# Patient Record
Sex: Female | Born: 1960
Health system: Southern US, Community
[De-identification: ages and names within clinical notes are randomized; demographics above are authoritative.]

## PROBLEM LIST (undated history)

## (undated) DIAGNOSIS — G245 Blepharospasm: Secondary | ICD-10-CM

## (undated) DIAGNOSIS — D219 Benign neoplasm of connective and other soft tissue, unspecified: Secondary | ICD-10-CM

## (undated) DIAGNOSIS — D259 Leiomyoma of uterus, unspecified: Secondary | ICD-10-CM

## (undated) HISTORY — DX: Leiomyoma of uterus, unspecified: D25.9

## (undated) HISTORY — DX: Blepharospasm: G24.5

## (undated) HISTORY — PX: BILATERAL SALPINGECTOMY: SHX5743

## (undated) HISTORY — DX: Benign neoplasm of connective and other soft tissue, unspecified: D21.9

---

## 2012-07-23 ENCOUNTER — Emergency Department (HOSPITAL_COMMUNITY)
Admission: EM | Admit: 2012-07-23 | Discharge: 2012-07-24 | Disposition: A | Discharge status: Home / Self Care | Payer: Self-pay | Attending: Emergency Medicine | Admitting: Emergency Medicine

## 2012-07-23 ENCOUNTER — Emergency Department (HOSPITAL_COMMUNITY): Payer: Self-pay

## 2012-07-23 ENCOUNTER — Encounter (HOSPITAL_COMMUNITY): Payer: Self-pay | Admitting: *Deleted

## 2012-07-23 DIAGNOSIS — M7989 Other specified soft tissue disorders: Secondary | ICD-10-CM | POA: Insufficient documentation

## 2012-07-23 DIAGNOSIS — Z79899 Other long term (current) drug therapy: Secondary | ICD-10-CM | POA: Insufficient documentation

## 2012-07-23 DIAGNOSIS — R0602 Shortness of breath: Secondary | ICD-10-CM | POA: Insufficient documentation

## 2012-07-23 DIAGNOSIS — R42 Dizziness and giddiness: Secondary | ICD-10-CM | POA: Insufficient documentation

## 2012-07-23 LAB — CBC WITH DIFFERENTIAL/PLATELET
Basophils Absolute: 0.1 K/uL (ref 0.0–0.1)
Basophils Relative: 1 % (ref 0–1)
Eosinophils Absolute: 0.4 K/uL (ref 0.0–0.7)
Eosinophils Relative: 9 % — ABNORMAL HIGH (ref 0–5)
HCT: 39.7 % (ref 36.0–46.0)
Hemoglobin: 13.8 g/dL (ref 12.0–15.0)
Lymphocytes Relative: 53 % — ABNORMAL HIGH (ref 12–46)
Lymphs Abs: 2.4 10*3/uL (ref 0.7–4.0)
MCH: 31.8 pg (ref 26.0–34.0)
MCHC: 34.8 g/dL (ref 30.0–36.0)
MCV: 91.5 fL (ref 78.0–100.0)
Monocytes Absolute: 0.4 10*3/uL (ref 0.1–1.0)
Monocytes Relative: 9 % (ref 3–12)
Neutro Abs: 1.2 10*3/uL — ABNORMAL LOW (ref 1.7–7.7)
Neutrophils Relative %: 28 % — ABNORMAL LOW (ref 43–77)
Platelets: 196 K/uL (ref 150–400)
RBC: 4.34 MIL/uL (ref 3.87–5.11)
RDW: 12.6 % (ref 11.5–15.5)
WBC: 4.5 K/uL (ref 4.0–10.5)

## 2012-07-23 LAB — COMPREHENSIVE METABOLIC PANEL
Albumin: 3.8 g/dL (ref 3.5–5.2)
Alkaline Phosphatase: 56 U/L (ref 39–117)
BUN: 15 mg/dL (ref 6–23)
CO2: 25 mEq/L (ref 19–32)
Chloride: 101 mEq/L (ref 96–112)
GFR calc Af Amer: 63 mL/min — ABNORMAL LOW (ref >90)
GFR calc non Af Amer: 54 mL/min — ABNORMAL LOW (ref >90)
Glucose, Bld: 85 mg/dL (ref 70–99)
Potassium: 3.8 mEq/L (ref 3.5–5.1)
Total Bilirubin: 0.3 mg/dL (ref 0.3–1.2)

## 2012-07-23 LAB — COMPREHENSIVE METABOLIC PANEL WITH GFR
ALT: 14 U/L (ref 0–35)
AST: 25 U/L (ref 0–37)
Calcium: 9.4 mg/dL (ref 8.4–10.5)
Creatinine, Ser: 1.15 mg/dL — ABNORMAL HIGH (ref 0.50–1.10)
Sodium: 137 meq/L (ref 135–145)
Total Protein: 7.8 g/dL (ref 6.0–8.3)

## 2012-07-23 LAB — URINALYSIS, ROUTINE W REFLEX MICROSCOPIC
Bilirubin Urine: NEGATIVE
Glucose, UA: NEGATIVE mg/dL
Hgb urine dipstick: NEGATIVE
Ketones, ur: NEGATIVE mg/dL
Nitrite: NEGATIVE
Protein, ur: NEGATIVE mg/dL
Specific Gravity, Urine: 1.004 — ABNORMAL LOW (ref 1.005–1.030)
Urobilinogen, UA: 0.2 mg/dL (ref 0.0–1.0)
pH: 6.5 (ref 5.0–8.0)

## 2012-07-23 LAB — URINE MICROSCOPIC-ADD ON

## 2012-07-23 LAB — TROPONIN I: Troponin I: <0.3 ng/mL (ref <0.30)

## 2012-07-23 NOTE — ED Provider Notes (Signed)
Medical screening examination/treatment/procedure(s) were performed by non-physician practitioner and as supervising physician I was immediately available for consultation/collaboration.   Gavin Pound. Oletta Lamas, MD 07/23/12 [phone]

## 2012-07-23 NOTE — ED Notes (Signed)
The pt is c/o bi-lateral leg swelling  For several days with dizziness.  No pain in her legs but tingling and numbness

## 2012-07-23 NOTE — ED Notes (Signed)
No known  Injury no pain anywhere.  No previous medical history

## 2012-07-23 NOTE — ED Provider Notes (Signed)
History    CSN: [id number] Arrival date & time 07/23/12  1948  First MD Initiated Contact with Patient 07/23/12 2133     Chief Complaint  Patient presents with  . Leg Swelling   (Consider location/radiation/quality/duration/timing/severity/associated sxs/prior Treatment) HPI  52 year old female with no significant past medical history presents complaining of leg swelling.patient reports for the past 2 weeks she has been experiencing shortness of breath when she wakes up in the middle of the night. States it takes her a little bit to catch her breath. she usually sleeps with 2 pillows. Yesterday while walking she noticed that both of her leg is swollen. Swelling seems to improve when she rest. Today she noticed the swelling returns with sensation of tightness around both of her legs. She also endorsed feeling dizziness for the past several days symptoms usually lasting only for a few minutes and resolved on its own. She denies fever, chills, headache, chest pain, dyspnea on exertion, abdominal pain, nausea, vomiting, diarrhea, or rash. She does not have any prior history of CHF. Has not changed her diet. Has no recent sickness. She has no chronic medical problems. Patient is a nonsmoker. No specific treatment tried. Patient denies prior history of PE or DVT. She denies recent surgery, taking exogenous hormone, recent long travel, or prolonged bed rest.  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    History reviewed. No pertinent past medical history. History reviewed. No pertinent past surgical history. No family history on file. History  Substance Use Topics  . Smoking status: Never Smoker   . Smokeless tobacco: Not on file  . Alcohol Use: No   OB History   Grav Para Term Preterm Abortions TAB SAB Ect Mult Living                 Review of Systems  All other systems reviewed and are negative.    Allergies  Review of  patient's allergies indicates no known allergies.  Home Medications   Current Outpatient Rx  Name  Route  Sig  Dispense  Refill  . fish oil-omega-3 fatty acids 1000 MG capsule   Oral   Take 1 g by mouth daily.         Marland Kitchen GARLIC PO   Oral   Take 2 tablets by mouth daily.          BP 123/72  Pulse 74  Temp(Src) 97.9 F (36.6 C)  Resp 20  SpO2 98% Physical Exam  Nursing note and vitals reviewed. Constitutional: She is oriented to person, place, and time. She appears well-developed and well-nourished. No distress.  Awake, alert, nontoxic appearance  HENT:  Head: Atraumatic.  Eyes: Conjunctivae are normal. Right eye exhibits no discharge. Left eye exhibits no discharge.  Neck: Neck supple.  No JVD.  Cardiovascular: Normal rate, regular rhythm and intact distal pulses.   Pulmonary/Chest: Effort normal. No respiratory distress. She exhibits no tenderness.  Abdominal: Soft. There is no tenderness. There is no rebound.  Musculoskeletal: She exhibits edema (trace pretibial edema noted to both legs. Normal dorsalis pedis pulse. Bilateral lower string cheese without palpable cords, erythema, negative Homans sign.). She exhibits no tenderness.  ROM appears intact, no obvious focal weakness  Neurological: She is alert and oriented to person, place, and time.  Mental status and motor strength appears intact  Skin: No rash noted.  Psychiatric: She has a normal mood and affect.  ED Course  Procedures (including critical care time)   Date: 07/23/2012  Rate: 68  Rhythm: normal sinus rhythm  QRS Axis: normal  Intervals: normal  ST/T Wave abnormalities: normal  Conduction Disutrbances: none  Narrative Interpretation:   Old EKG Reviewed: no prior for comparison    9:46 PM Patient here complaining of intermittent bouts of shortness of breath and also having edema of lower legs. She is in no acute respiratory distress. Her lungs are clear on auscultation. She has very mild  pretibial edema to both legs. She has no significant risk factor concerning for PE or DVT. She has no other chronic medical problems. Workup initiated.  11:23 PM Patient's labs are reassuring. Chest x-ray shows shows a density adjacent to the cardiac apex that is likely represents a pericardial fat pad, pericardial cyst, or a focus of pneumonia, however pulmonary parenchymal mass lesion is difficult to exclude.I discussed the finding with patient and recommend followup chest CT in the next 6 months for further evaluation. Patient denies cough or having fever concerning for pneumonia.  Her BNP is unremarkable. Do not suspect CHF as a cause. Care discussed with attending. Will give patient's resources for outpatient followup. Return precautions discussed.  Labs Reviewed  CBC WITH DIFFERENTIAL - Abnormal; Notable for the following:    Neutrophils Relative % 28 (*)    Neutro Abs 1.2 (*)    Lymphocytes Relative 53 (*)    Eosinophils Relative 9 (*)    All other components within normal limits  URINALYSIS, ROUTINE W REFLEX MICROSCOPIC - Abnormal; Notable for the following:    Specific Gravity, Urine 1.004 (*)    Leukocytes, UA TRACE (*)    All other components within normal limits  URINE MICROSCOPIC-ADD ON  COMPREHENSIVE METABOLIC PANEL  TROPONIN I   Dg Chest 2 View  07/23/2012   *RADIOLOGY REPORT*  Clinical Data: Short of breath.  CHEST - 2 VIEW  Comparison: None.  Findings: Opacity is present along the left heart border extending to the costophrenic angle.  This is not well seen on the lateral view.  Statistically this may represent pericardial fat pad, pericardial cyst, or a focus of pneumonia, however a pulmonary parenchymal mass lesion is difficult to exclude.  Consider follow- up chest CT with infusion for further evaluation. The cardiopericardial silhouette appears within normal limits.  Right lung clear.  IMPRESSION: Density adjacent to the cardiac apex as detailed above.  Consider follow-up  chest CT for further evaluation.   Original Report Authenticated By: Andreas Newport, M.D.   1. Leg swelling     MDM  BP 123/72  Pulse 74  Temp(Src) 97.9 F (36.6 C)  Resp 20  SpO2 98%  I have reviewed nursing notes and vital signs. I personally reviewed the imaging tests through PACS system  I reviewed available ER/hospitalization records thought the EMR   Fayrene Helper, [place] 07/23/12 2336

## 2013-03-08 ENCOUNTER — Other Ambulatory Visit: Payer: Self-pay | Admitting: Emergency Medicine

## 2013-03-08 DIAGNOSIS — J984 Other disorders of lung: Secondary | ICD-10-CM

## 2013-03-08 DIAGNOSIS — R9389 Abnormal findings on diagnostic imaging of other specified body structures: Secondary | ICD-10-CM

## 2013-03-20 ENCOUNTER — Other Ambulatory Visit: Payer: Self-pay

## 2013-03-26 ENCOUNTER — Other Ambulatory Visit: Payer: Self-pay | Admitting: Obstetrics & Gynecology

## 2013-03-26 DIAGNOSIS — R229 Localized swelling, mass and lump, unspecified: Secondary | ICD-10-CM

## 2013-03-26 DIAGNOSIS — IMO0002 Reserved for concepts with insufficient information to code with codable children: Secondary | ICD-10-CM

## 2013-03-26 DIAGNOSIS — D259 Leiomyoma of uterus, unspecified: Secondary | ICD-10-CM

## 2013-03-27 ENCOUNTER — Other Ambulatory Visit: Payer: Self-pay | Admitting: Obstetrics & Gynecology

## 2013-03-27 ENCOUNTER — Other Ambulatory Visit (HOSPITAL_COMMUNITY): Payer: Self-pay | Admitting: Obstetrics & Gynecology

## 2013-03-27 ENCOUNTER — Ambulatory Visit (HOSPITAL_COMMUNITY)
Admission: RE | Admit: 2013-03-27 | Discharge: 2013-03-27 | Disposition: A | Discharge status: Home / Self Care | Payer: BC Managed Care – PPO | Source: Ambulatory Visit | Attending: Obstetrics & Gynecology | Admitting: Obstetrics & Gynecology

## 2013-03-27 ENCOUNTER — Ambulatory Visit
Admission: RE | Admit: 2013-03-27 | Discharge: 2013-03-27 | Disposition: A | Discharge status: Home / Self Care | Payer: BC Managed Care – PPO | Source: Ambulatory Visit | Attending: Obstetrics & Gynecology | Admitting: Obstetrics & Gynecology

## 2013-03-27 DIAGNOSIS — R609 Edema, unspecified: Secondary | ICD-10-CM

## 2013-03-27 DIAGNOSIS — Z09 Encounter for follow-up examination after completed treatment for conditions other than malignant neoplasm: Secondary | ICD-10-CM

## 2013-03-27 DIAGNOSIS — M7989 Other specified soft tissue disorders: Secondary | ICD-10-CM

## 2013-03-27 NOTE — Progress Notes (Signed)
VASCULAR LAB PRELIMINARY  PRELIMINARY  PRELIMINARY  PRELIMINARY  Bilateral lower extremity venous duplex completed.    Preliminary report:  Bilateral:  No evidence of DVT, superficial thrombosis, or Baker's Cyst.   Stacey Weiss, RVS 03/27/2013, 5:04 PM

## 2013-04-02 ENCOUNTER — Other Ambulatory Visit: Payer: BC Managed Care – PPO

## 2013-04-02 ENCOUNTER — Other Ambulatory Visit: Payer: Self-pay

## 2013-04-03 ENCOUNTER — Other Ambulatory Visit: Payer: Self-pay | Admitting: Obstetrics & Gynecology

## 2013-04-03 ENCOUNTER — Ambulatory Visit
Admission: RE | Admit: 2013-04-03 | Discharge: 2013-04-03 | Disposition: A | Discharge status: Home / Self Care | Payer: BC Managed Care – PPO | Source: Ambulatory Visit | Attending: Obstetrics & Gynecology | Admitting: Obstetrics & Gynecology

## 2013-04-03 DIAGNOSIS — D259 Leiomyoma of uterus, unspecified: Secondary | ICD-10-CM

## 2013-04-03 DIAGNOSIS — R229 Localized swelling, mass and lump, unspecified: Secondary | ICD-10-CM

## 2013-04-03 DIAGNOSIS — IMO0002 Reserved for concepts with insufficient information to code with codable children: Secondary | ICD-10-CM

## 2013-04-03 MED ORDER — IOHEXOL 300 MG/ML  SOLN
100.0000 mL | Freq: Once | INTRAMUSCULAR | Status: AC | PRN
  Administered 2013-04-03: 100 mL via INTRAVENOUS

## 2013-04-05 ENCOUNTER — Encounter (HOSPITAL_COMMUNITY): Payer: Self-pay | Admitting: Pharmacist

## 2013-04-06 ENCOUNTER — Ambulatory Visit (INDEPENDENT_AMBULATORY_CARE_PROVIDER_SITE_OTHER): Payer: BC Managed Care – PPO | Admitting: Internal Medicine

## 2013-04-06 ENCOUNTER — Encounter: Payer: Self-pay | Admitting: Internal Medicine

## 2013-04-06 VITALS — BP 116/68 | HR 63 | Ht 59.0 in | Wt 144.0 lb

## 2013-04-06 DIAGNOSIS — Q248 Other specified congenital malformations of heart: Secondary | ICD-10-CM

## 2013-04-06 NOTE — Progress Notes (Signed)
   Subjective:    Patient ID: Stacey Weiss, female    DOB: 06-26-1960, 53 y.o.   MRN: [id number]  HPI  50 yobf from [place] never smoker evaluated by [facility] Cardiologist 2009 with pericardial cyst who did not recommend any intervention referred 04/06/2013 by Dr Dellis Filbert for preop pulmonary eval for hysterectomy with abn cxr/ ct.  04/06/2013 1st [facility] office visit/ Stacey Weiss    Chief Complaint  Patient presents with  . Advice Only    Referred for sx clearance..pt had a nodule on ct chest.  pt denies any breathing complaints at this time.   Not limited by breathing from desired activities   No obvious other patterns in day to day or daytime variabilty or assoc chronic cough or cp or chest tightness, subjective wheeze overt sinus or hb symptoms. No unusual exp hx or h/o childhood pna/ asthma or knowledge of premature birth.  Sleeping ok without nocturnal  or early am exacerbation  of respiratory  c/o's or need for noct saba. Also denies any obvious fluctuation of symptoms with weather or environmental changes or other aggravating or alleviating factors except as outlined above   Current Medications, Allergies, Complete Past Medical History, Past Surgical History, Family History, and Social History were reviewed in [employer] record.            Review of Systems  Constitutional: Negative for fever and unexpected weight change.  HENT: Negative for congestion, dental problem, ear pain, nosebleeds, postnasal drip, rhinorrhea, sinus pressure, sneezing, sore throat and trouble swallowing.   Eyes: Negative for redness and itching.  Respiratory: Negative for cough, chest tightness, shortness of breath and wheezing.   Cardiovascular: Negative for palpitations and leg swelling.  Gastrointestinal: Negative for nausea and vomiting.  Genitourinary: Negative for dysuria.  Musculoskeletal: Negative for joint swelling.  Skin: Negative for rash.  Neurological: Negative for  headaches.  Hematological: Does not bruise/bleed easily.  Psychiatric/Behavioral: Negative for dysphoric mood. The patient is not nervous/anxious.        Objective:   Physical Exam  Wt Readings from Last 3 Encounters:  04/06/13 144 lb (65.318 kg)     HEENT: nl dentition, turbinates, and orophanx. Nl external ear canals without cough reflex   NECK :  without JVD/Nodes/TM/ nl carotid upstrokes bilaterally   LUNGS: no acc muscle use, clear to A and P bilaterally without cough on insp or exp maneuvers   CV:  RRR  no s3 or murmur or increase in P2, no edema   ABD:  Massive uterine enlargement occupies at least half of abdominal cavity but good diaphragm excursion supine   MS:  warm without deformities, calf tenderness, cyanosis or clubbing  SKIN: warm and dry without lesions    NEURO:  alert, approp, no deficits     CT chest 04/03/13 The structure at the left cardiophrenic angle demonstrates an  appearance typical for a benign appearing pericardial cyst.      Assessment & Plan:

## 2013-04-06 NOTE — Patient Instructions (Signed)
You have a cyst in your chest that does not require surgery and is not a problem that would complicate your uterine surgery  Pulmonary follow up is as needed

## 2013-04-07 DIAGNOSIS — Q248 Other specified congenital malformations of heart: Secondary | ICD-10-CM | POA: Insufficient documentation

## 2013-04-07 NOTE — Assessment & Plan Note (Signed)
Discussed in detail with pt and radiology/ Dr Marilu Favre - this was eval by cards > 5 y prior to OV  And decision then as now is that in all likelihood this is a completely benign congenital pericardial cyst with no potential for malignancy nor "mass effect" on the thoracic cavity so rec  1) no further f/u 2) no contraindication to planned hysterectomy 3) pulmonary f/u prn

## 2013-04-09 NOTE — Patient Instructions (Addendum)
   Your procedure is scheduled on:  Tuesday, Mat 24  Enter through the [employer] of [facility] at:  Purdy up the phone at the desk and dial [phone] and inform us of your arrival.  Please call this number if you have any problems the morning of surgery: [phone]  Remember: Do not eat food after midnight: Monday Do not drink clear liquids after: 9 AM Tuesday, day of surgery Take these medicines the morning of surgery with a SIP OF WATER:  None  Do not wear jewelry, make-up, or FINGER nail polish No metal in your hair or on your body. Do not wear lotions, powders, perfumes.  You may wear deodorant.  Do not bring valuables to the hospital. Contacts, dentures or bridgework may not be worn into surgery.  Leave suitcase in the car. After Surgery it may be brought to your room. For patients being admitted to the hospital, checkout time is 11:00am the day of discharge.  Home with husband Akpan

## 2013-04-10 ENCOUNTER — Encounter (HOSPITAL_COMMUNITY): Payer: Self-pay

## 2013-04-10 ENCOUNTER — Encounter (HOSPITAL_COMMUNITY)
Admission: RE | Admit: 2013-04-10 | Discharge: 2013-04-10 | Disposition: A | Discharge status: Home / Self Care | Payer: BC Managed Care – PPO | Source: Ambulatory Visit | Attending: Obstetrics & Gynecology | Admitting: Obstetrics & Gynecology

## 2013-04-10 DIAGNOSIS — Z01812 Encounter for preprocedural laboratory examination: Secondary | ICD-10-CM | POA: Insufficient documentation

## 2013-04-10 LAB — CBC
HEMATOCRIT: 41.7 % (ref 36.0–46.0)
Hemoglobin: 14.4 g/dL (ref 12.0–15.0)
MCH: 32.4 pg (ref 26.0–34.0)
MCHC: 34.5 g/dL (ref 30.0–36.0)
MCV: 93.7 fL (ref 78.0–100.0)
PLATELETS: 180 10*3/uL (ref 150–400)
RBC: 4.45 MIL/uL (ref 3.87–5.11)
RDW: 12.3 % (ref 11.5–15.5)
WBC: 4.1 10*3/uL (ref 4.0–10.5)

## 2013-04-17 ENCOUNTER — Inpatient Hospital Stay (HOSPITAL_COMMUNITY)
Admission: RE | Admit: 2013-04-17 | Discharge: 2013-04-19 | DRG: 743 | Disposition: A | Discharge status: Home / Self Care | Payer: BC Managed Care – PPO | Source: Ambulatory Visit | Attending: Obstetrics & Gynecology | Admitting: Obstetrics & Gynecology

## 2013-04-17 ENCOUNTER — Encounter (HOSPITAL_COMMUNITY): Payer: BC Managed Care – PPO | Admitting: Anesthesiology

## 2013-04-17 ENCOUNTER — Inpatient Hospital Stay (HOSPITAL_COMMUNITY): Payer: BC Managed Care – PPO | Admitting: Anesthesiology

## 2013-04-17 ENCOUNTER — Encounter (HOSPITAL_COMMUNITY)
Admission: RE | Disposition: A | Discharge status: Home / Self Care | Payer: Self-pay | Source: Ambulatory Visit | Attending: Obstetrics & Gynecology

## 2013-04-17 ENCOUNTER — Encounter (HOSPITAL_COMMUNITY): Payer: Self-pay | Admitting: Anesthesiology

## 2013-04-17 DIAGNOSIS — R609 Edema, unspecified: Secondary | ICD-10-CM | POA: Diagnosis present

## 2013-04-17 DIAGNOSIS — D251 Intramural leiomyoma of uterus: Secondary | ICD-10-CM | POA: Diagnosis present

## 2013-04-17 DIAGNOSIS — Z9889 Other specified postprocedural states: Secondary | ICD-10-CM

## 2013-04-17 DIAGNOSIS — D252 Subserosal leiomyoma of uterus: Secondary | ICD-10-CM | POA: Diagnosis present

## 2013-04-17 DIAGNOSIS — N949 Unspecified condition associated with female genital organs and menstrual cycle: Secondary | ICD-10-CM | POA: Diagnosis present

## 2013-04-17 DIAGNOSIS — D250 Submucous leiomyoma of uterus: Principal | ICD-10-CM | POA: Diagnosis present

## 2013-04-17 HISTORY — PX: ABDOMINAL HYSTERECTOMY: SHX81

## 2013-04-17 LAB — TYPE AND SCREEN
ABO/RH(D): O POS
ANTIBODY SCREEN: NEGATIVE

## 2013-04-17 LAB — PREGNANCY, URINE: Preg Test, Ur: NEGATIVE

## 2013-04-17 LAB — ABO/RH: ABO/RH(D): O POS

## 2013-04-17 SURGERY — HYSTERECTOMY, ABDOMINAL
Anesthesia: General

## 2013-04-17 MED ORDER — HEPARIN SODIUM (PORCINE) 5000 UNIT/ML IJ SOLN
INTRAMUSCULAR | Status: DC | PRN
  Administered 2013-04-17: 5000 [IU] via SUBCUTANEOUS

## 2013-04-17 MED ORDER — OXYCODONE-ACETAMINOPHEN 5-325 MG PO TABS: 1.0000 | ORAL_TABLET | ORAL | Status: DC | PRN

## 2013-04-17 MED ORDER — CEFAZOLIN SODIUM-DEXTROSE 2-3 GM-% IV SOLR
2.0000 g | INTRAVENOUS | Status: AC
  Administered 2013-04-17: 2 g via INTRAVENOUS

## 2013-04-17 MED ORDER — FLUMAZENIL 0.5 MG/5ML IV SOLN
INTRAVENOUS | Status: DC | PRN
  Administered 2013-04-17: 0.2 mg via INTRAVENOUS

## 2013-04-17 MED ORDER — LIDOCAINE HCL (CARDIAC) 20 MG/ML IV SOLN
INTRAVENOUS | Status: DC | PRN
  Administered 2013-04-17: 70 mg via INTRAVENOUS
  Administered 2013-04-17: 30 mg via INTRAVENOUS

## 2013-04-17 MED ORDER — EPHEDRINE SULFATE 50 MG/ML IJ SOLN
INTRAMUSCULAR | Status: DC | PRN
  Administered 2013-04-17: 15 mg via INTRAVENOUS

## 2013-04-17 MED ORDER — MEPERIDINE HCL 25 MG/ML IJ SOLN
6.2500 mg | INTRAMUSCULAR | Status: DC | PRN
  Administered 2013-04-17: 6.25 mg via INTRAVENOUS

## 2013-04-17 MED ORDER — LACTATED RINGERS IV SOLN
INTRAVENOUS | Status: DC
  Administered 2013-04-17 (×3): via INTRAVENOUS

## 2013-04-17 MED ORDER — MEPERIDINE HCL 25 MG/ML IJ SOLN
INTRAMUSCULAR | Status: AC
  Filled 2013-04-17: qty 1

## 2013-04-17 MED ORDER — KETOROLAC TROMETHAMINE 30 MG/ML IJ SOLN
INTRAMUSCULAR | Status: DC | PRN
  Administered 2013-04-17: 30 mg via INTRAVENOUS

## 2013-04-17 MED ORDER — BUPIVACAINE HCL (PF) 0.25 % IJ SOLN
INTRAMUSCULAR | Status: DC | PRN
  Administered 2013-04-17: 10 mL

## 2013-04-17 MED ORDER — HEPARIN SODIUM (PORCINE) 5000 UNIT/ML IJ SOLN
INTRAMUSCULAR | Status: AC
  Filled 2013-04-17: qty 1

## 2013-04-17 MED ORDER — ONDANSETRON HCL 4 MG/2ML IJ SOLN: 4.0000 mg | Freq: Four times a day (QID) | INTRAMUSCULAR | Status: DC | PRN

## 2013-04-17 MED ORDER — MIDAZOLAM HCL 2 MG/2ML IJ SOLN
INTRAMUSCULAR | Status: AC
  Filled 2013-04-17: qty 2

## 2013-04-17 MED ORDER — PROPOFOL 10 MG/ML IV BOLUS
INTRAVENOUS | Status: DC | PRN
  Administered 2013-04-17: 150 mg via INTRAVENOUS
  Administered 2013-04-17: 20 mg via INTRAVENOUS

## 2013-04-17 MED ORDER — HYDROMORPHONE HCL PF 1 MG/ML IJ SOLN
INTRAMUSCULAR | Status: AC
  Filled 2013-04-17: qty 1

## 2013-04-17 MED ORDER — IBUPROFEN 600 MG PO TABS
600.0000 mg | ORAL_TABLET | Freq: Four times a day (QID) | ORAL | Status: DC | PRN
  Administered 2013-04-18 (×2): 600 mg via ORAL
  Filled 2013-04-17 (×2): qty 1

## 2013-04-17 MED ORDER — CEFAZOLIN SODIUM-DEXTROSE 2-3 GM-% IV SOLR
INTRAVENOUS | Status: AC
  Filled 2013-04-17: qty 50

## 2013-04-17 MED ORDER — KETOROLAC TROMETHAMINE 30 MG/ML IJ SOLN: 15.0000 mg | Freq: Once | INTRAMUSCULAR | Status: DC | PRN

## 2013-04-17 MED ORDER — LACTATED RINGERS IV SOLN
INTRAVENOUS | Status: DC
  Administered 2013-04-17 – 2013-04-18 (×2): via INTRAVENOUS

## 2013-04-17 MED ORDER — HYDROMORPHONE HCL PF 1 MG/ML IJ SOLN
0.2500 mg | INTRAMUSCULAR | Status: DC | PRN
  Administered 2013-04-17: 0.25 mg via INTRAVENOUS

## 2013-04-17 MED ORDER — ONDANSETRON HCL 4 MG/2ML IJ SOLN
INTRAMUSCULAR | Status: DC | PRN
  Administered 2013-04-17: 4 mg via INTRAVENOUS

## 2013-04-17 MED ORDER — ROCURONIUM BROMIDE 100 MG/10ML IV SOLN
INTRAVENOUS | Status: DC | PRN
  Administered 2013-04-17: 10 mg via INTRAVENOUS
  Administered 2013-04-17: 40 mg via INTRAVENOUS

## 2013-04-17 MED ORDER — ONDANSETRON HCL 4 MG/2ML IJ SOLN
INTRAMUSCULAR | Status: AC
  Filled 2013-04-17: qty 2

## 2013-04-17 MED ORDER — MIDAZOLAM HCL 2 MG/2ML IJ SOLN
INTRAMUSCULAR | Status: DC | PRN
  Administered 2013-04-17 (×2): 1 mg via INTRAVENOUS

## 2013-04-17 MED ORDER — PROMETHAZINE HCL 25 MG/ML IJ SOLN: 6.2500 mg | INTRAMUSCULAR | Status: DC | PRN

## 2013-04-17 MED ORDER — GLYCOPYRROLATE 0.2 MG/ML IJ SOLN
INTRAMUSCULAR | Status: DC | PRN
  Administered 2013-04-17: 0.6 mg via INTRAVENOUS

## 2013-04-17 MED ORDER — DEXAMETHASONE SODIUM PHOSPHATE 10 MG/ML IJ SOLN
INTRAMUSCULAR | Status: AC
  Filled 2013-04-17: qty 1

## 2013-04-17 MED ORDER — HYDROMORPHONE HCL PF 1 MG/ML IJ SOLN: 1.0000 mg | INTRAMUSCULAR | Status: DC | PRN

## 2013-04-17 MED ORDER — BUPIVACAINE HCL (PF) 0.25 % IJ SOLN
INTRAMUSCULAR | Status: AC
  Filled 2013-04-17: qty 30

## 2013-04-17 MED ORDER — FLUMAZENIL 0.5 MG/5ML IV SOLN
INTRAVENOUS | Status: AC
  Filled 2013-04-17: qty 5

## 2013-04-17 MED ORDER — FENTANYL CITRATE 0.05 MG/ML IJ SOLN
INTRAMUSCULAR | Status: DC | PRN
  Administered 2013-04-17 (×2): 50 ug via INTRAVENOUS
  Administered 2013-04-17: 100 ug via INTRAVENOUS
  Administered 2013-04-17 (×3): 50 ug via INTRAVENOUS

## 2013-04-17 MED ORDER — DEXAMETHASONE SODIUM PHOSPHATE 10 MG/ML IJ SOLN
INTRAMUSCULAR | Status: DC | PRN
  Administered 2013-04-17: 10 mg via INTRAVENOUS

## 2013-04-17 MED ORDER — ROCURONIUM BROMIDE 100 MG/10ML IV SOLN
INTRAVENOUS | Status: AC
  Filled 2013-04-17: qty 1

## 2013-04-17 MED ORDER — FENTANYL CITRATE 0.05 MG/ML IJ SOLN
INTRAMUSCULAR | Status: AC
  Filled 2013-04-17: qty 5

## 2013-04-17 MED ORDER — ONDANSETRON HCL 4 MG PO TABS: 4.0000 mg | ORAL_TABLET | Freq: Four times a day (QID) | ORAL | Status: DC | PRN

## 2013-04-17 MED ORDER — NEOSTIGMINE METHYLSULFATE 1 MG/ML IJ SOLN
INTRAMUSCULAR | Status: DC | PRN
  Administered 2013-04-17: 3 mg via INTRAVENOUS

## 2013-04-17 MED ORDER — GLYCOPYRROLATE 0.2 MG/ML IJ SOLN
INTRAMUSCULAR | Status: AC
  Filled 2013-04-17: qty 3

## 2013-04-17 MED ORDER — PROPOFOL 10 MG/ML IV EMUL
INTRAVENOUS | Status: AC
  Filled 2013-04-17: qty 20

## 2013-04-17 MED ORDER — EPHEDRINE 5 MG/ML INJ
INTRAVENOUS | Status: AC
  Filled 2013-04-17: qty 10

## 2013-04-17 MED ORDER — FENTANYL CITRATE 0.05 MG/ML IJ SOLN
INTRAMUSCULAR | Status: AC
  Filled 2013-04-17: qty 2

## 2013-04-17 SURGICAL SUPPLY — 49 items
BLADE SURG 10 STRL SS (BLADE) ×3 IMPLANT
CANISTER SUCT 3000ML (MISCELLANEOUS) ×3 IMPLANT
CHLORAPREP W/TINT 26ML (MISCELLANEOUS) ×3 IMPLANT
CLOTH BEACON ORANGE TIMEOUT ST (SAFETY) ×3 IMPLANT
DECANTER SPIKE VIAL GLASS SM (MISCELLANEOUS) IMPLANT
DERMABOND ADHESIVE PROPEN (GAUZE/BANDAGES/DRESSINGS) ×4
DERMABOND ADVANCED .7 DNX6 (GAUZE/BANDAGES/DRESSINGS) ×2 IMPLANT
DRAPE WARM FLUID 44X44 (DRAPE) IMPLANT
DRSG OPSITE POSTOP 4X10 (GAUZE/BANDAGES/DRESSINGS) ×3 IMPLANT
GAUZE SPONGE 4X4 16PLY XRAY LF (GAUZE/BANDAGES/DRESSINGS) ×3 IMPLANT
GLOVE BIO SURGEON STRL SZ 6.5 (GLOVE) ×2 IMPLANT
GLOVE BIO SURGEONS STRL SZ 6.5 (GLOVE) ×1
GLOVE BIOGEL PI IND STRL 7.0 (GLOVE) ×1 IMPLANT
GLOVE BIOGEL PI INDICATOR 7.0 (GLOVE) ×2
GOWN STRL REUS W/TWL LRG LVL3 (GOWN DISPOSABLE) ×9 IMPLANT
NEEDLE HYPO 25X1 1.5 SAFETY (NEEDLE) IMPLANT
PACK ABDOMINAL GYN (CUSTOM PROCEDURE TRAY) ×3 IMPLANT
PAD ABD 7.5X8 STRL (GAUZE/BANDAGES/DRESSINGS) ×3 IMPLANT
PAD OB MATERNITY 4.3X12.25 (PERSONAL CARE ITEMS) ×3 IMPLANT
PROTECTOR NERVE ULNAR (MISCELLANEOUS) ×3 IMPLANT
RETRACTOR WND ALEXIS 25 LRG (MISCELLANEOUS) ×1 IMPLANT
RTRCTR WOUND ALEXIS 25CM LRG (MISCELLANEOUS) ×3
SPONGE LAP 18X18 X RAY DECT (DISPOSABLE) ×6 IMPLANT
STAPLER VISISTAT 35W (STAPLE) ×3 IMPLANT
SUT MNCRL AB 3-0 PS2 27 (SUTURE) ×6 IMPLANT
SUT PDS AB 0 CTX 60 (SUTURE) ×3 IMPLANT
SUT PLAIN 2 0 (SUTURE) ×2
SUT PLAIN 3 0 CT 1 27 (SUTURE) IMPLANT
SUT PLAIN ABS 2-0 CT1 27XMFL (SUTURE) ×1 IMPLANT
SUT PROLENE 3 0 SH DA (SUTURE) IMPLANT
SUT VIC AB 0 CT1 18XCR BRD8 (SUTURE) ×3 IMPLANT
SUT VIC AB 0 CT1 27 (SUTURE) ×8
SUT VIC AB 0 CT1 27XBRD ANBCTR (SUTURE) ×4 IMPLANT
SUT VIC AB 0 CT1 36 (SUTURE) ×6 IMPLANT
SUT VIC AB 0 CT1 8-18 (SUTURE) ×6
SUT VIC AB 2-0 CT1 27 (SUTURE) ×2
SUT VIC AB 2-0 CT1 TAPERPNT 27 (SUTURE) ×1 IMPLANT
SUT VIC AB 2-0 SH 27 (SUTURE)
SUT VIC AB 2-0 SH 27XBRD (SUTURE) IMPLANT
SUT VIC AB 3-0 SH 27 (SUTURE)
SUT VIC AB 3-0 SH 27X BRD (SUTURE) IMPLANT
SUT VICRYL 0 TIES 12 18 (SUTURE) ×3 IMPLANT
SUT VICRYL 1 TIES 12X18 (SUTURE) IMPLANT
SUT VICRYL 3 0 BR 18  UND (SUTURE)
SUT VICRYL 3 0 BR 18 UND (SUTURE) IMPLANT
SYR CONTROL 10ML LL (SYRINGE) IMPLANT
TOWEL OR 17X24 6PK STRL BLUE (TOWEL DISPOSABLE) ×6 IMPLANT
TRAY FOLEY CATH 14FR (SET/KITS/TRAYS/PACK) ×3 IMPLANT
WATER STERILE IRR 1000ML POUR (IV SOLUTION) ×6 IMPLANT

## 2013-04-17 NOTE — Transfer of Care (Signed)
Immediate Anesthesia Transfer of Care Note  Patient: Stacey Weiss  Procedure(s) Performed: Procedure(s): HYSTERECTOMY ABDOMINAL BILATERAL SALPINGECTOMY Through Vertical Incision (N/A)  Patient Location: PACU  Anesthesia Type:General  Level of Consciousness: awake, sedated and patient cooperative  Airway & Oxygen Therapy: Patient Spontanous Breathing and Patient connected to face mask oxygen  Post-op Assessment: Report given to PACU RN and Post -op Vital signs reviewed and stable  Post vital signs: Reviewed and stable  Complications: No apparent anesthesia complications

## 2013-04-17 NOTE — Anesthesia Postprocedure Evaluation (Signed)
  Anesthesia Post-op Note  Patient: Stacey Weiss  Procedure(s) Performed: Procedure(s): HYSTERECTOMY ABDOMINAL BILATERAL SALPINGECTOMY Through Vertical Incision (N/A)  Patient Location: PACU  Anesthesia Type:General  Level of Consciousness: awake, alert  and oriented  Airway and Oxygen Therapy: Patient Spontanous Breathing  Post-op Pain: mild  Post-op Assessment: Post-op Vital signs reviewed, Patient's Cardiovascular Status Stable, Respiratory Function Stable, Patent Airway, No signs of Nausea or vomiting and Pain level controlled  Post-op Vital Signs: Reviewed and stable  Complications: No apparent anesthesia complications

## 2013-04-17 NOTE — Op Note (Signed)
04/17/2013  1:59 PM  PATIENT:  Stacey Weiss  53 y.o. female  PRE-OPERATIVE DIAGNOSIS:  Large Uterine Myomas, Pelvic Pain, lower limb oedema  POST-OPERATIVE DIAGNOSIS:  Large Uterine Myomas, Pelvic Pain, lower limb oedema  PROCEDURE:  TOTAL ABDOMINAL HYSTERECTOMY, BILATERAL SALPINGECTOMIES, LEFT OOPHORECTOMY  THROUGH A MIDLINE VERTICAL INCISION  SURGEON:  Surgeon(s): Princess Bruins, MD Elveria Royals, MD  ASSISTANTS: Elveria Royals MD   ANESTHESIA:   general  PROCEDURE:  With endotracheal intubation the patient is in the caput dorsal position. She is prepped with Betadine on the abdomen, suprapubic area, vulva and vagina.  A Foley is inserted in the bladder. She is draped as usual.  We infiltrate the subcutaneous tissue with Marcaine one quarter plain 10 cc at the site of the median vertical incision.  We make a median vertical incision with the scalpel.  We opened the adipose tissue with the electrocautery.  The aponeurosis was opened longitudinally with the electrocautery as well. Cokers were applied on the left side of the aponeurosis.  Dissection was done done with Metzenbaums scissors to separate the rectus muscle from the aponeurosis.  The peritoneum was opened longitudinally with Metzenbaums scissors.  We did peritoneal washings. The uterus with fibroids was still too large to be exteriorized through that incision, it was therefore extended on the left side of the umbilicus.  We were then able to completely exteriorize the uterus which had multiple large fibroids.  All ligaments and pedicles were extremely elongated.  After noting that both ovaries were normal to inspection in volume and appearance, given that the patient is menopausal at 53 years of age, the decision was made to preserve one ovary only.  We started on the left side, we made a window in the broad ligament. We double clamped the left infundibulopelvic ligament with curved Heaneys.  We sectioned in between with Mayo  scissors. We time sutured with of Vicryl 0 and double the suture with a free tie. We then grasped the left round ligament with a Babcock, we sutured with a Vicryl 0 and section with the electrocautery.  We started opening the visceral peritoneum anteriorly with the electrocautery.  We then went on the right side, we started at the right round ligament.  The right round ligament was grasped with a Babcock. We sutured with a Vicryl 0 and sectioned with the electrocautery.  Both round ligaments were A Hemostat.  We further opened the visceral peritoneum anteriorly and lowered the bladder.  We then made a window in the right broad ligament. We on double clamped the right utero ovarian ligament with curved Heaneys. We sectioned in between. We sutured with a Vicryl 0 and doubled the suture with a free tie.  We skeletonized the right uterine artery.  We then clamped the right uterine artery with a curved Heaney, we clamped with a straight Heaney to prevent backflow, sectioned with Mayo scissors and sutured with a Vicryl 0.  We skeletonized the left uterine artery.  We clamped with a curved Heaney and a straight Heaney for the low. We section with Mayo scissors and sutured with 0 Vicryl 0.  At that point we sectioned the upper cervix transversely to are removed the uterus with the fibroids, both tubes and the left ovary.  Cokers were put on the cervix.  We used the Alexis retractor at this point with 3 laps to push the bowels upward. We followed either side of the cervix with straight Heaneys clamping and sectioning and suturing with  Vicryl 0 in a Heaney stitches until we reached the angle of the vagina.  Then we used a curved Heaney to clamp on either side, sectionned with Mayo scissors and sutured with Heaney stitches of Vicryl 0 on both angles.  The angles were kept on a hemostat.   We completed closure of the vagina with a Vicryl 0 in a figure-of-eight in the middle.  The angles were attached to the uterosacral ligament  of the respective side.  We attached the right round ligament to the pedicle of the right ovary suspension.  We irrigated and suctioned the abdominopelvic cavities.  Hemostasis was confirmed at all levels.  We removed the Alexis retractor and the 3 laps that were put in the abdomen.   We closed the peritoneum with a running suture of Vicryl 2-0. We closed the aponeurosis with a running looped double suture of PDS. We reapproximated the adipose tissue with a running suture of plain 2-0. We reapproximated the skin with a subcuticular stitch of Monocryl 4-0.   A pressure dressing was applied with a honeycomb dressing. The patient was brought to recovery room in good and stable status.  ESTIMATED BLOOD LOSS:  200 cc   Intake/Output Summary (Last 24 hours) at 04/17/13 1359 Last data filed at 04/17/13 1339  Gross per 24 hour  Intake   2200 ml  Output    550 ml  Net   1650 ml     BLOOD ADMINISTERED:none   LOCAL MEDICATIONS USED:  MARCAINE     SPECIMEN:  Source of Specimen:  Uterus with large myomas, both tubes and left ovary  DISPOSITION OF SPECIMEN:  PATHOLOGY  COUNTS:  YES  PLAN OF CARE: Transfer to PACU  Princess Bruins MD  04/17/2013 at 2:00 pm

## 2013-04-17 NOTE — H&P (Signed)
Stacey Weiss is an 53 y.o. female  G0 (1 adopted child)  RP:  Very large myomatous uterus for TAH/Bilat. salpingectomies  Pertinent Gynecological History: Menses: Menopause x 6 mths Bleeding: None x 6 mths Contraception: none Blood transfusions: none Sexually transmitted diseases: no past history Previous GYN Procedures: None  Last mammogram: normal per patient, but none recently Last pap: normal 02/2013. OB History: G0  Menstrual History:  No LMP recorded. Patient is postmenopausal.    No past medical history on file.  No past surgical history on file.  Family History  Problem Relation Age of Onset  . Allergies Sister   . Rheum arthritis Mother   . Rheum arthritis Sister     Social History:  reports that she has never smoked. She has never used smokeless tobacco. She reports that she does not drink alcohol or use illicit drugs.  Allergies: No Known Allergies  No prescriptions prior to admission    ROS  Blood pressure 109/75, pulse 62, temperature 97.5 F (36.4 C), temperature source Oral, resp. rate 20, SpO2 98.00%. Physical Exam  Pelvic US very large myomatous uterus extending to liver area, difficult to measure by [place]. CT abdo/pelvis:  Uterus with myomas, overall dimensions:  23+ cm x 21+ cm x 10.6 cm. CT chest:  Left pericardial cyst 7.2 cm.  Seen by Pulmonology. Lower limb dopplers neg 03/2013.  Results for orders placed during the hospital encounter of 04/17/13 (from the past 24 hour(s))  PREGNANCY, URINE     Status: None   Collection Time    04/17/13  9:53 AM      Result Value Ref Range   Preg Test, Ur NEGATIVE  NEGATIVE    No results found.  Assessment/Plan:  Very large myomatous uterus extending towards liver area, 9.3 cm above umbilicus for TAH/Bilat. Salpingectomy.  Vertical incision.  Surgery and risks reviewed.   Benign left pericardial cyst 7.2 cm.   Mishawn Hemann,MARIE-LYNE 04/17/2013, 10:44 AM

## 2013-04-17 NOTE — Anesthesia Preprocedure Evaluation (Signed)
Anesthesia Evaluation    Reviewed: Allergy & Precautions, H&P , NPO status , Patient's Chart, lab work & pertinent test results  Airway Mallampati: I TM Distance: >3 FB Neck ROM: full    Dental no notable dental hx. (+) Teeth Intact, Chipped   Pulmonary neg pulmonary ROS,          Cardiovascular negative cardio ROS      Neuro/Psych negative neurological ROS  negative psych ROS   GI/Hepatic negative GI ROS, Neg liver ROS,   Endo/Other  negative endocrine ROS  Renal/GU negative Renal ROS     Musculoskeletal   Abdominal Normal abdominal exam  (+)   Peds  Hematology negative hematology ROS (+)   Anesthesia Other Findings   Reproductive/Obstetrics negative OB ROS                           Anesthesia Physical Anesthesia Plan  ASA: I  Anesthesia Plan: General   Post-op Pain Management:    Induction: Intravenous  Airway Management Planned: Oral ETT  Additional Equipment:   Intra-op Plan:   Post-operative Plan: Extubation in OR  Informed Consent: I have reviewed the patients History and Physical, chart, labs and discussed the procedure including the risks, benefits and alternatives for the proposed anesthesia with the patient or authorized representative who has indicated his/her understanding and acceptance.   Dental Advisory Given  Plan Discussed with: CRNA and Surgeon  Anesthesia Plan Comments:         Anesthesia Quick Evaluation

## 2013-04-18 ENCOUNTER — Encounter (HOSPITAL_COMMUNITY): Payer: Self-pay | Admitting: Obstetrics & Gynecology

## 2013-04-18 LAB — CBC
HEMATOCRIT: 34 % — AB (ref 36.0–46.0)
Hemoglobin: 11.7 g/dL — ABNORMAL LOW (ref 12.0–15.0)
MCH: 32.1 pg (ref 26.0–34.0)
MCHC: 34.4 g/dL (ref 30.0–36.0)
MCV: 93.4 fL (ref 78.0–100.0)
Platelets: 170 10*3/uL (ref 150–400)
RBC: 3.64 MIL/uL — AB (ref 3.87–5.11)
RDW: 12.3 % (ref 11.5–15.5)
WBC: 7 10*3/uL (ref 4.0–10.5)

## 2013-04-18 MED ORDER — INFLUENZA VAC SPLIT QUAD 0.5 ML IM SUSP
0.5000 mL | INTRAMUSCULAR | Status: AC
  Administered 2013-04-19: 0.5 mL via INTRAMUSCULAR
  Filled 2013-04-18: qty 0.5

## 2013-04-18 MED FILL — Heparin Sodium (Porcine) Inj 5000 Unit/ML: INTRAMUSCULAR | Qty: 1 | Status: AC

## 2013-04-18 NOTE — Addendum Note (Signed)
Addendum created 04/18/13 0825 by Flossie Dibble, CRNA   Modules edited: Notes Section   Notes Section:  File: [id number]

## 2013-04-18 NOTE — Progress Notes (Signed)
1 Day Post-Op Procedure(s) (LRB): HYSTERECTOMY ABDOMINAL BILATERAL SALPINGECTOMY Through Vertical Incision (N/A)  Subjective: Patient reports that pain is well managed.  Tolerating normal diet as tolerated  diet without difficulty. No nausea / vomiting.  Ambulating and voiding.  Objective: BP 113/57  Pulse 77  Temp(Src) 98.5 F (36.9 C) (Oral)  Resp 17  Ht 4\' 11"  (1.499 m)  Wt 63 kg (138 lb 14.2 oz)  BMI 28.04 kg/m2  SpO2 94% Lungs: clear Heart: normal rate and rhythm Abdomen:soft and appropriately tender Extremities: Homans sign is negative, no sign of DVT Incision: healing well  Post op Hb 11.7  Assessment: s/p Procedure(s): HYSTERECTOMY ABDOMINAL BILATERAL SALPINGECTOMY Through Vertical Incision: stable, progressing well and tolerating diet  Plan: Will d/c home tomorrow.  LOS: 1 day    Mattea Seger,MARIE-LYNE 04/18/2013, 5:02 PM

## 2013-04-18 NOTE — Progress Notes (Signed)
Ur chart review completed.  

## 2013-04-18 NOTE — Anesthesia Postprocedure Evaluation (Signed)
Anesthesia Post Note  Patient: Stacey Weiss  Procedure(s) Performed: Procedure(s): HYSTERECTOMY ABDOMINAL BILATERAL SALPINGECTOMY Through Vertical Incision (N/A)  Anesthesia type: General  Patient location: Women's Unit  Post pain: Pain level controlled  Post assessment: Post-op Vital signs reviewed  Last Vitals: BP 101/54  Pulse 70  Temp(Src) 36.9 C (Oral)  Resp 18  Ht 4\' 11"  (1.499 m)  Wt 138 lb 14.2 oz (63 kg)  BMI 28.04 kg/m2  SpO2 96%  Post vital signs: Reviewed  Level of consciousness: awake  Complications: No apparent anesthesia complications

## 2013-04-19 MED ORDER — HYDROCODONE-IBUPROFEN 5-200 MG PO TABS: 1.0000 | ORAL_TABLET | Freq: Three times a day (TID) | ORAL | Status: DC | PRN

## 2013-04-19 NOTE — Discharge Instructions (Signed)
Hysterectomy °Care After °Refer to this sheet in the next few weeks. These instructions provide you with information on caring for yourself after your procedure. Your caregiver may also give you more specific instructions. Your treatment has been planned according to current medical practices, but problems sometimes occur. Call your caregiver if you have any problems or questions after your procedure. °HOME CARE INSTRUCTIONS  °Healing will take time. You may have discomfort, tenderness, swelling, and bruising at the surgical site for about 2 weeks. This is normal and will get better as time goes on. °· Only take over-the-counter or prescription medicines for pain, discomfort, or fever as directed by your caregiver. °· Do not take aspirin. It can cause bleeding. °· Do not drive when taking pain medicine. °· Follow your caregiver's advice regarding exercise, lifting, driving, and general activities. °· Resume your usual diet as directed and allowed. °· Get plenty of rest and sleep. °· Do not douche, use tampons, or have sexual intercourse for at least 6 weeks or until your caregiver gives you permission. °· Change your bandages (dressings) as directed by your caregiver. °· Monitor your temperature. °· Take showers instead of baths for 2 to 3 weeks. °· Do not drink alcohol until your caregiver gives you permission. °· If you are constipated, you may take a mild laxative with your caregiver's permission. Bran foods may help with constipation problems. Drinking enough fluids to keep your urine clear or pale yellow may help as well. °· Try to have someone home with you for 1 or 2 weeks to help around the house. °· Keep all of your follow-up appointments as directed by your caregiver. °SEEK MEDICAL CARE IF:  °· You have swelling, redness, or increasing pain in the surgical cut (incision) area. °· You have pus coming from the incision. °· You notice a bad smell coming from the incision or dressing. °· You have swelling,  redness, or pain around the intravenous (IV) site. °· Your incision breaks open. °· You feel dizzy or lightheaded. °· You have pain or bleeding when you urinate. °· You have persistent diarrhea. °· You have persistent nausea and vomiting. °· You have abnormal vaginal discharge. °· You have a rash. °· You have any type of abnormal reaction or develop an allergy to your medicine. °· Your pain is not controlled with your prescribed medicine. °SEEK IMMEDIATE MEDICAL CARE IF:  °· You have a fever. °· You have severe abdominal pain. °· You have chest pain. °· You have shortness of breath. °· You faint. °· You have pain, swelling, or redness of your leg. °· You have heavy vaginal bleeding with blood clots. °MAKE SURE YOU: °· Understand these instructions. °· Will watch your condition. °· Will get help right away if you are not doing well or get worse. °Document Released: 07/31/2004 Document Revised: 04/05/2011 Document Reviewed: 08/28/2010 °[employer]® Patient Information ©2014 [employer]. ° °

## 2013-04-19 NOTE — Progress Notes (Signed)
Pt discharged home with husband... Condition stable... No equipment... Ambulated to car with E. Caldwell, RN. 

## 2013-04-19 NOTE — Discharge Summary (Signed)
  Physician Discharge Summary  Patient ID: Stacey Weiss MRN: [id number] DOB/AGE: 03-08-1960 53 y.o.  Admit date: 04/17/2013 Discharge date: 04/19/2013  Admission Diagnoses: Large Uterine Myomas, Pelvic Pain  Discharge Diagnoses: Large Uterine Myomas, Pelvic Pain        Active Problems:   Postoperative state   Discharged Condition: good  Hospital Course: excellent  Consults: None  Treatments: surgery: Total abdominal hysterectomy with bilateral salpingectomies and left oophorectomy  Disposition: 01-Home or Self Care     Medication List         hydrocodone-ibuprofen 5-200 MG per tablet  Commonly known as:  VICOPROFEN  Take 1 tablet by mouth every 8 (eight) hours as needed for pain.           Follow-up Information   Follow up with Jaylyn Booher,MARIE-LYNE, MD In 3 weeks.   Specialty:  Obstetrics and Gynecology   Contact information:   [place] [address] [phone]       Signed: Princess Bruins, MD 04/19/2013, 1:14 PM

## 2013-04-19 NOTE — Progress Notes (Signed)
2 Days Post-Op Procedure(s) (LRB): HYSTERECTOMY ABDOMINAL BILATERAL SALPINGECTOMY Through Vertical Incision (N/A)  Subjective: Patient reports that pain is well managed.  Tolerating normal diet as tolerated  diet without difficulty. No nausea / vomiting.  Ambulating and voiding.  Objective: BP 100/64  Pulse 75  Temp(Src) 98.2 F (36.8 C) (Oral)  Resp 18  Ht 4\' 11"  (1.499 m)  Wt 63 kg (138 lb 14.2 oz)  BMI 28.04 kg/m2  SpO2 98% Lungs: clear Heart: normal rate and rhythm Abdomen:soft and appropriately tender Extremities: Homans sign is negative, no sign of DVT Incision: healing well Hb post op 11.7  Assessment: s/p Procedure(s): HYSTERECTOMY ABDOMINAL BILATERAL SALPINGECTOMY Through Vertical Incision: progressing well  Plan: Discharge home  LOS: 2 days    Rasha Ibe,MARIE-LYNE 04/19/2013, 1:05 PM

## 2013-05-16 ENCOUNTER — Encounter: Payer: Self-pay | Admitting: *Deleted

## 2013-05-16 ENCOUNTER — Encounter: Payer: Self-pay | Admitting: Neurology

## 2013-05-17 ENCOUNTER — Encounter: Payer: Self-pay | Admitting: Neurology

## 2013-05-17 ENCOUNTER — Encounter (INDEPENDENT_AMBULATORY_CARE_PROVIDER_SITE_OTHER): Payer: Self-pay

## 2013-05-17 ENCOUNTER — Ambulatory Visit (INDEPENDENT_AMBULATORY_CARE_PROVIDER_SITE_OTHER): Payer: BC Managed Care – PPO | Admitting: Neurology

## 2013-05-17 VITALS — BP 109/67 | HR 86 | Ht <= 58 in | Wt 140.0 lb

## 2013-05-17 DIAGNOSIS — R209 Unspecified disturbances of skin sensation: Secondary | ICD-10-CM

## 2013-05-17 DIAGNOSIS — G245 Blepharospasm: Secondary | ICD-10-CM

## 2013-05-17 HISTORY — DX: Blepharospasm: G24.5

## 2013-05-17 NOTE — Patient Instructions (Signed)
   Go to your eye doctor and ask about getting tear duct plugs placed.

## 2013-05-17 NOTE — Progress Notes (Signed)
Reason for visit: Blepharospasm  Stacey Weiss is a 53 y.o. female  History of present illness:  Stacey Weiss is a 53 year old right-handed black female with a three-year history of problems with excessive blinking. The patient indicates that when the problem initially began, she had a lot of tearing of the eye, and she has had issues with sensation of eye discomfort and irritation. The patient indicates that if she tries to keep her eyes open too long, the pain significantly increases. The patient has discomfort in the eyes when she tries to look down. The patient has been seen by 3 different ophthalmologists. She was told that she may have dryness of the eyes, and she was treated with Restasis with minimal benefit. She was told that she had allergies, and was treated with antihistamines without benefit. She went to [facility], and she was told that she needed surgery for ptosis of the eyelids. The patient comes to this office for another evaluation. She has also had issues with some numbness of the arms primarily, but also with the legs. The numbness has come and gone over time, but within the last one year, the numbness has come back. She denies any definite weakness, or balance issues. She denies headaches. Bright light will bother her eyes. She is still able to operate a motor vehicle, but she has to be careful about not blinking too frequently. She comes to this office for further evaluation.  Past Medical History  Diagnosis Date  . Fibroid   . Leiomyoma of uterus, unspecified   . Blepharospasm 05/17/2013    Past Surgical History  Procedure Laterality Date  . Abdominal hysterectomy N/A 04/17/2013    Procedure: HYSTERECTOMY ABDOMINAL BILATERAL SALPINGECTOMY Through Vertical Incision;  Surgeon: Stacey Bruins, MD;  Location: [facility];  Service: Gynecology;  Laterality: N/A;  . Bilateral salpingectomy      Family History  Problem Relation Age of Onset    . Allergies Sister   . Rheum arthritis Mother   . Rheum arthritis Sister     Social history:  reports that she has never smoked. She has never used smokeless tobacco. She reports that she does not drink alcohol or use illicit drugs.  Medications:  Current Outpatient Prescriptions on File Prior to Visit  Medication Sig Dispense Refill  . albuterol (PROVENTIL HFA;VENTOLIN HFA) 108 (90 BASE) MCG/ACT inhaler Inhale 1 puff into the lungs every 6 (six) hours as needed for wheezing or shortness of breath.       No current facility-administered medications on file prior to visit.     No Known Allergies  ROS:  Out of a complete 14 system review of symptoms, the patient complains only of the following symptoms, and all other reviewed systems are negative.  Swelling in the legs  Blurred vision Flushing Numbness  Blood pressure 109/67, pulse 86, height 4' 9.5" (1.461 m), weight 140 lb (63.504 kg).  Physical Exam  General: The patient is alert and cooperative at the time of the examination.  Eyes: Pupils are equal, round, and reactive to light. Discs are flat bilaterally.  Neck: The neck is supple, no carotid bruits are noted.  Respiratory: The respiratory examination is clear.  Cardiovascular: The cardiovascular examination reveals a regular rate and rhythm, no obvious murmurs or rubs are noted.  Skin: Extremities are without significant edema.  Neurologic Exam  Mental status: The patient is alert and oriented x 3 at the time of the examination. The patient has apparent  normal recent and remote memory, with an apparently normal attention span and concentration ability.  Cranial nerves: Facial symmetry is present. There is good sensation of the face to pinprick and soft touch bilaterally. The strength of the facial muscles and the muscles to head turning and shoulder shrug are normal bilaterally. Speech is well enunciated, no aphasia or dysarthria is noted. Extraocular movements  are full. Visual fields are full. The tongue is midline, and the patient has symmetric elevation of the soft palate. No obvious hearing deficits are noted. The patient does have frequent blinking throughout the examination.  Motor: The motor testing reveals 5 over 5 strength of all 4 extremities. Good symmetric motor tone is noted throughout.  Sensory: Sensory testing is intact to pinprick, soft touch, vibration sensation, and position sense on all 4 extremities. No evidence of extinction is noted.  Coordination: Cerebellar testing reveals good finger-nose-finger and heel-to-shin bilaterally.  Gait and station: Gait is normal. Tandem gait is normal. Romberg is negative. No drift is seen.  Reflexes: Deep tendon reflexes are symmetric and normal bilaterally. Toes are downgoing bilaterally.   Assessment/Plan:  1. Frequent blinking  The patient indicates that she has frequent blinking, apparently as a response to eye discomfort and irritation. She will have associated tearing with this irritation. The clinical history supports the diagnosis of dryness of the eyes. For this reason, and because the patient has some numbness of the extremities, we will initiate an evaluation for possible Sjogren's syndrome. The patient will have blood work drawn today. I have recommended that she go back to her ophthalmologist and consider getting tear duct plugs placed to see if this improves her symptoms. She will followup through this office on an as-needed basis. The history does not support a primary movement disorder such as Meige's syndrome.  Jill Alexanders MD 05/17/2013 7:29 PM  [facility] [address] [place], [place] [address]  Phone [phone] Fax [phone]

## 2013-05-18 LAB — ANA W/REFLEX: ANA: NEGATIVE

## 2013-05-18 LAB — SJOGREN'S SYNDROME ANTIBODS(SSA + SSB): ENA SSA (RO) Ab: <0.2 AI (ref 0.0–0.9)

## 2013-05-18 LAB — ANGIOTENSIN CONVERTING ENZYME: ANGIO CONVERT ENZYME: 50 U/L (ref 14–82)

## 2013-05-18 LAB — SEDIMENTATION RATE: SED RATE: 15 mm/h (ref 0–40)

## 2015-09-29 IMAGING — CR DG CHEST 2V
2 series · 2 of 2 positions shown · non-contrast
Comparison: Chest x-ray 07/23/2012.

CLINICAL DATA: Followup evaluation for previous chest x-ray
demonstrating a potential mass.

EXAM:
CHEST  2 VIEW

[view not recorded (1 of 2)]
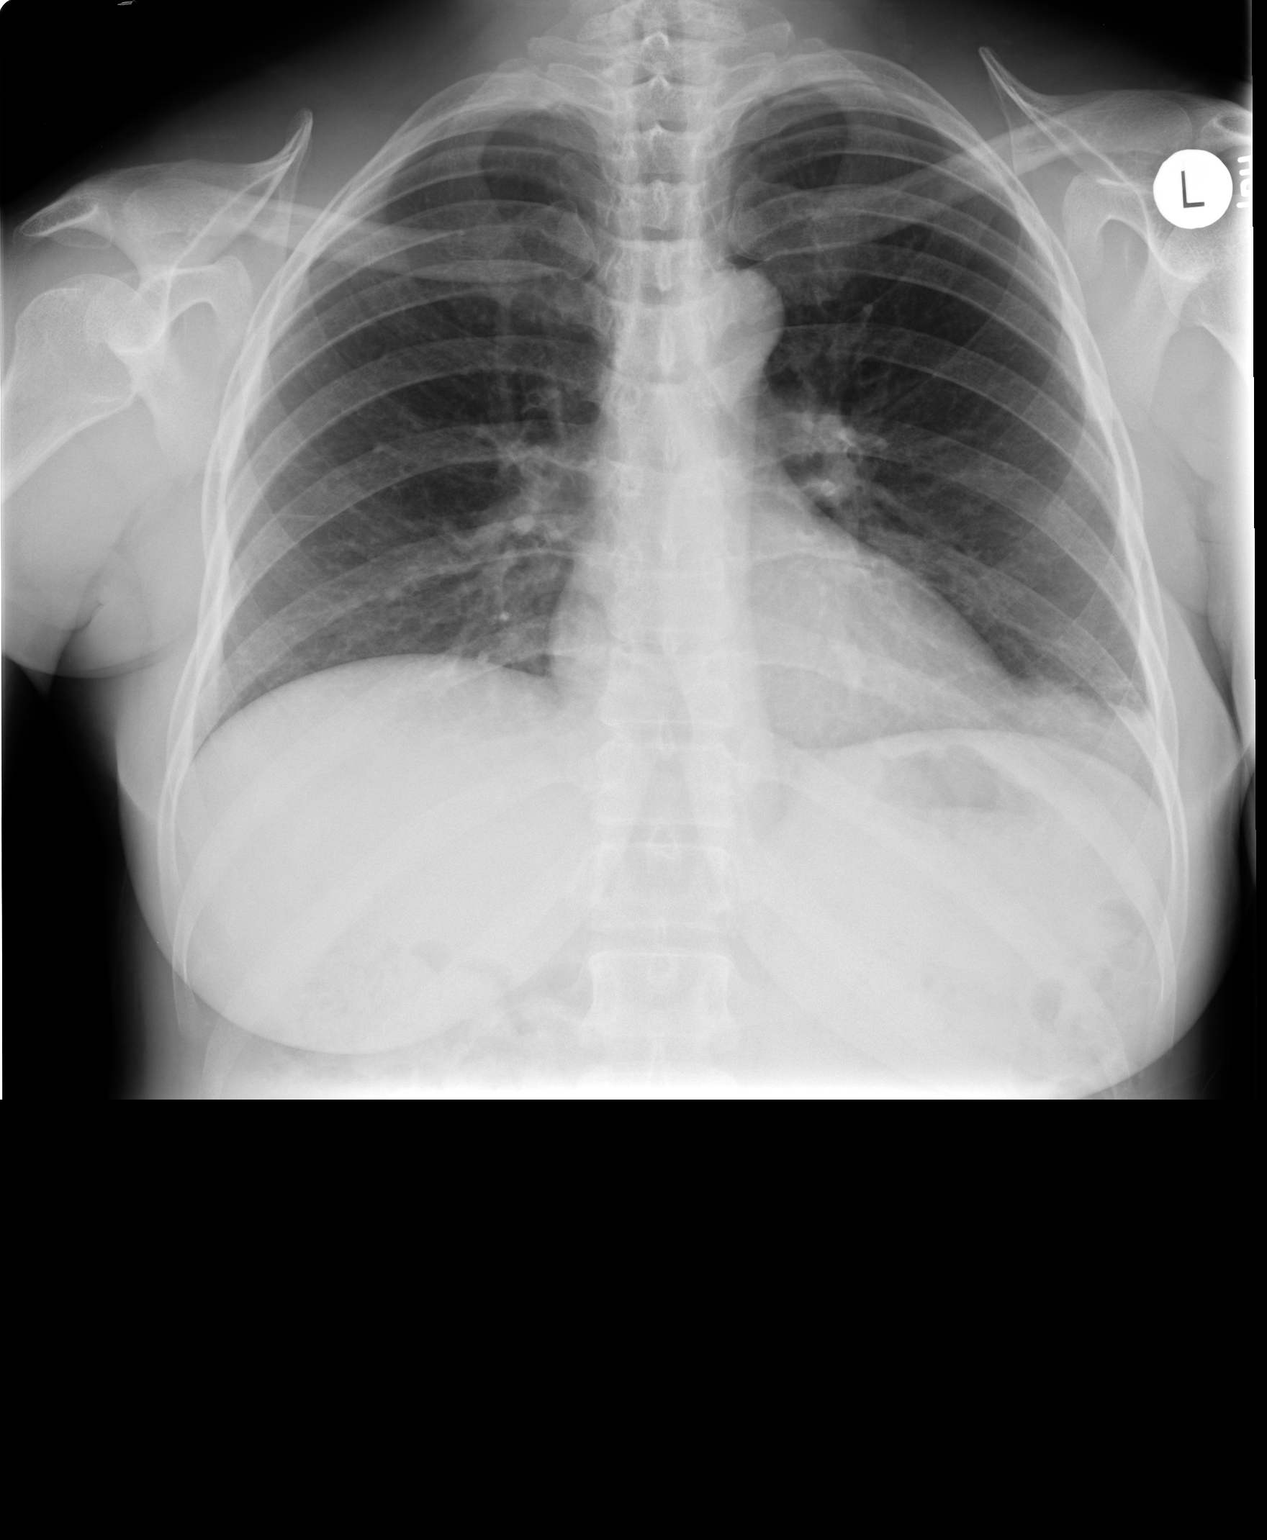

[view not recorded (2 of 2)]
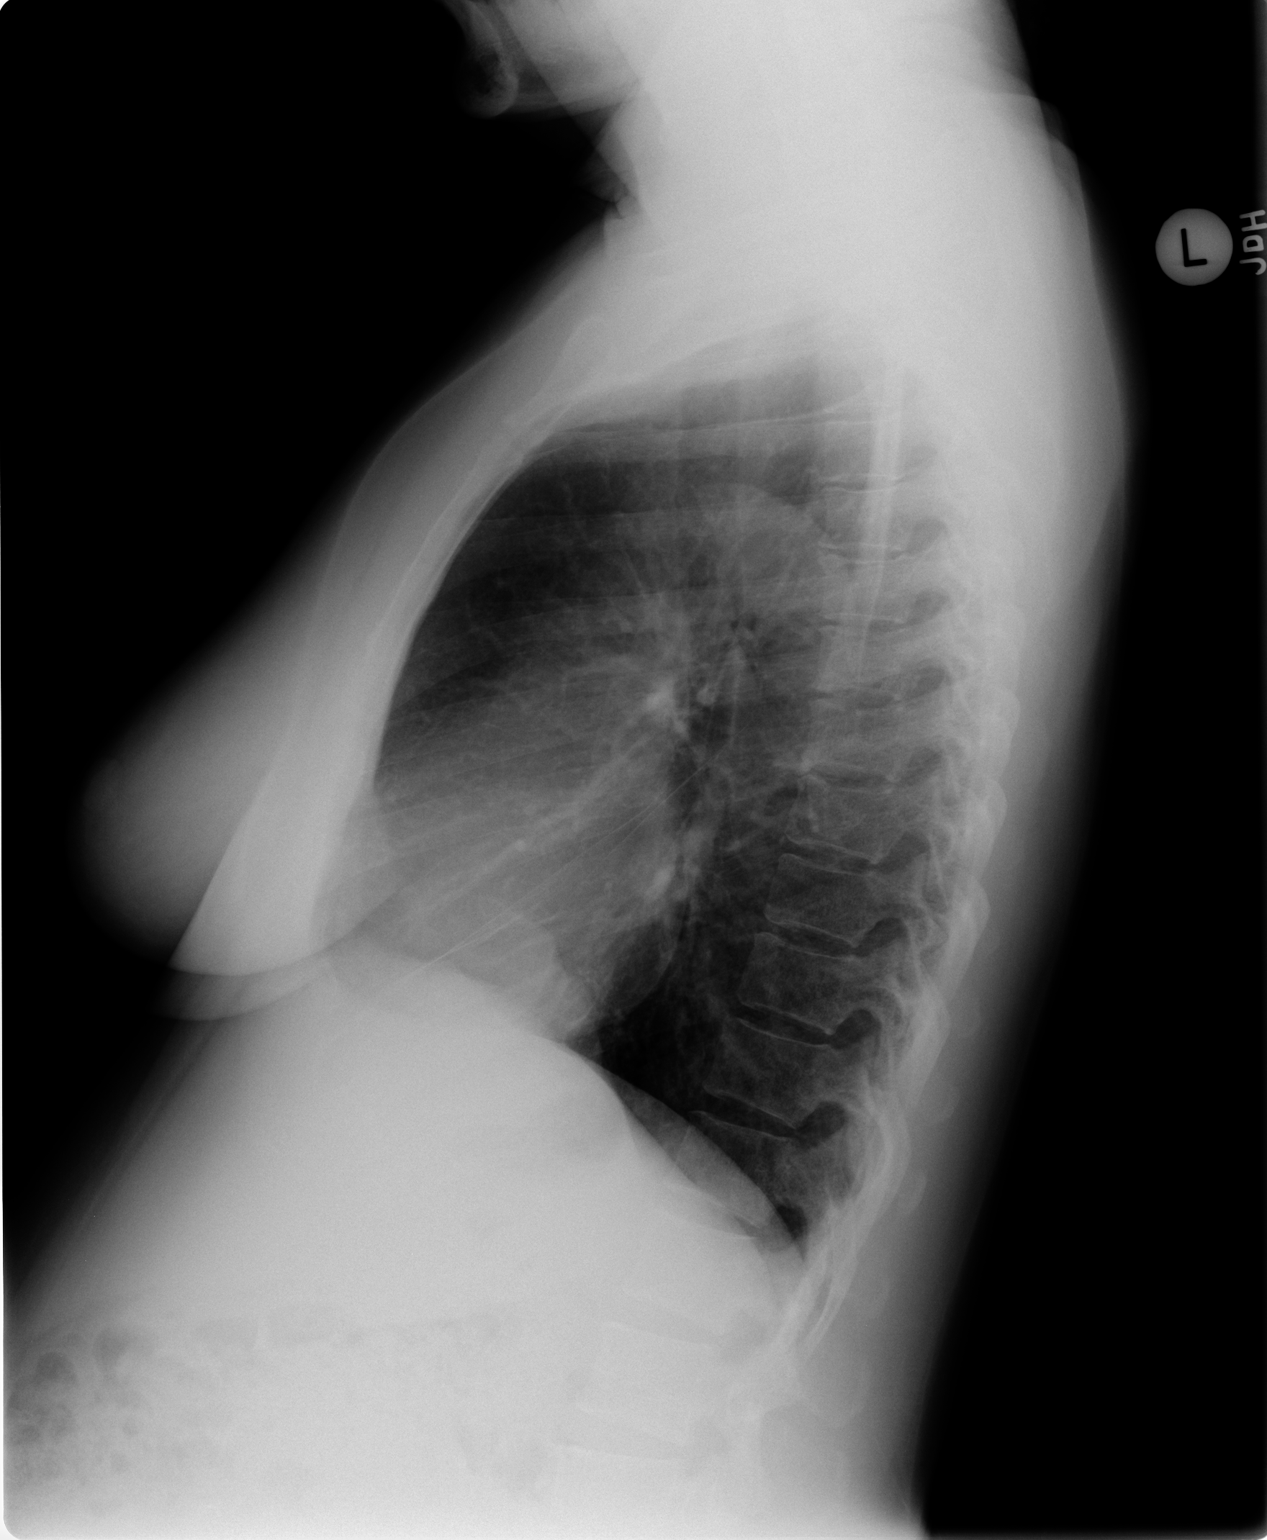

[2 of 2 positions shown; findings below may reference images not displayed]

FINDINGS: As noted on the prior study there is a pleural based mass like
opacity in the inferior aspect of the left hemithorax which abuts
the apex of the heart. This is similar in size to the prior study,
estimated to measure approximately 4.5 x 1.9 x 7.6 cm. The lungs
themselves are otherwise clear. No pleural effusion. No evidence of
pulmonary edema. Heart size is normal. Upper mediastinal contours
are within normal limits.
IMPRESSION: 1. Pleural based mass like opacity in the lower left hemithorax is
similar to remote prior study from 07/23/2012. Fortunately, this
stability suggests a benign etiology, and primary differential
considerations include a fibrous tumor of the pleura, prominent
epicardial fat pad, or unusually positioned pericardial cyst.
However, this is poorly evaluated on today's plain film examination.
Further evaluation with contrast enhanced chest CT is strongly
recommended at this time.

These results will be called to the ordering clinician or
representative by the Radiologist Assistant, and communication
documented in the PACS Dashboard.

## 2016-01-14 ENCOUNTER — Emergency Department (HOSPITAL_COMMUNITY): Payer: PRIVATE HEALTH INSURANCE

## 2016-01-14 ENCOUNTER — Emergency Department (HOSPITAL_COMMUNITY)
Admission: EM | Admit: 2016-01-14 | Discharge: 2016-01-14 | Disposition: A | Discharge status: Home / Self Care | Payer: PRIVATE HEALTH INSURANCE | Attending: Emergency Medicine | Admitting: Emergency Medicine

## 2016-01-14 DIAGNOSIS — S59902A Unspecified injury of left elbow, initial encounter: Secondary | ICD-10-CM | POA: Diagnosis present

## 2016-01-14 DIAGNOSIS — Z79899 Other long term (current) drug therapy: Secondary | ICD-10-CM | POA: Diagnosis not present

## 2016-01-14 DIAGNOSIS — Y999 Unspecified external cause status: Secondary | ICD-10-CM | POA: Insufficient documentation

## 2016-01-14 DIAGNOSIS — Y939 Activity, unspecified: Secondary | ICD-10-CM | POA: Insufficient documentation

## 2016-01-14 DIAGNOSIS — Y9289 Other specified places as the place of occurrence of the external cause: Secondary | ICD-10-CM | POA: Insufficient documentation

## 2016-01-14 DIAGNOSIS — W010XXA Fall on same level from slipping, tripping and stumbling without subsequent striking against object, initial encounter: Secondary | ICD-10-CM | POA: Diagnosis not present

## 2016-01-14 DIAGNOSIS — S53125A Posterior dislocation of left ulnohumeral joint, initial encounter: Secondary | ICD-10-CM | POA: Insufficient documentation

## 2016-01-14 MED ORDER — FENTANYL CITRATE (PF) 100 MCG/2ML IJ SOLN
100.0000 ug | Freq: Once | INTRAMUSCULAR | Status: AC
  Administered 2016-01-14: 100 ug via INTRAVENOUS
  Filled 2016-01-14: qty 2

## 2016-01-14 MED ORDER — LIDOCAINE HCL 2 % IJ SOLN
10.0000 mL | Freq: Once | INTRAMUSCULAR | Status: AC
  Administered 2016-01-14: 200 mg
  Filled 2016-01-14: qty 20

## 2016-01-14 MED ORDER — ONDANSETRON HCL 4 MG/2ML IJ SOLN
4.0000 mg | Freq: Once | INTRAMUSCULAR | Status: AC
  Administered 2016-01-14: 4 mg via INTRAVENOUS
  Filled 2016-01-14: qty 2

## 2016-01-14 MED ORDER — MORPHINE SULFATE (PF) 4 MG/ML IV SOLN
4.0000 mg | Freq: Once | INTRAVENOUS | Status: AC
  Administered 2016-01-14: 4 mg via INTRAVENOUS
  Filled 2016-01-14: qty 1

## 2016-01-14 MED ORDER — MORPHINE SULFATE 15 MG PO TABS: 15.0000 mg | ORAL_TABLET | ORAL | 0 refills | Status: DC | PRN

## 2016-01-14 MED ORDER — PROPOFOL 10 MG/ML IV BOLUS
60.0000 mg | Freq: Once | INTRAVENOUS | Status: AC
  Administered 2016-01-14: 60 mg via INTRAVENOUS
  Filled 2016-01-14: qty 20

## 2016-01-14 NOTE — ED Triage Notes (Signed)
Per EMS, pt was at [facility] in the restroom when she slipped and fell. Pt was unable to get up and staff found pt sitting on the floor. Pt denies loss of consciousness. Pt only complains of pain in the left arm. Pt received 136mcg of fentanyl via EMS.

## 2016-01-14 NOTE — ED Notes (Signed)
MD, ortho tech and respiratory at bedside for conscious sedation. Crash cart outside of room. Consent form signed at bedside. Patient placed on cardiac monitor and CO2 monitor.

## 2016-01-14 NOTE — Discharge Instructions (Signed)
Keep the arm elevated. Follow up with ortho in a week.

## 2016-01-14 NOTE — ED Provider Notes (Signed)
[facility] DEPT Provider Note   CSN: [id number] Arrival date & time: 01/14/16  1523     History   Chief Complaint No chief complaint on file.   HPI Stacey Weiss is a 55 y.o. female.  55 yo F with a chief complaints of left elbow pain. This occurred after the patient had a mechanical fall. States that she slipped on the ground and landed on her elbow. Has pain localized to that area. Denies other areas of injury. Denies head injury neck pain back pain. Denies abdominal pain or chest pain. Patient having some significant pain with movement. Was given 200 mg of fentanyl with EMS en route with some improvement.   The history is provided by the patient.  Arm Injury   This is a new problem. The current episode started less than 1 hour ago. The problem occurs constantly. The problem has not changed since onset.The pain is present in the left elbow. The quality of the pain is described as sharp. The pain is at a severity of 10/10. The pain is severe. She has tried nothing for the symptoms. The treatment provided no relief. There has been a history of trauma.    Past Medical History:  Diagnosis Date  . Blepharospasm 05/17/2013  . Fibroid   . Leiomyoma of uterus, unspecified     Patient Active Problem List   Diagnosis Date Noted  . Blepharospasm 05/17/2013  . Postoperative state 04/17/2013  . 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 ABDOMINAL HYSTERECTOMY N/A 04/17/2013   Procedure: HYSTERECTOMY ABDOMINAL BILATERAL SALPINGECTOMY Through Vertical Incision;  Surgeon: Princess Bruins, MD;  Location: [facility];  Service: Gynecology;  Laterality: N/A;  . BILATERAL SALPINGECTOMY      OB History    No data available       Home Medications    Prior to Admission medications   Medication Sig Start Date End Date Taking? Authorizing Provider  cholecalciferol (VITAMIN D) 1000 units tablet Take 1,000 Units by mouth daily.   Yes  Historical Provider, MD  albuterol (PROVENTIL HFA;VENTOLIN HFA) 108 (90 BASE) MCG/ACT inhaler Inhale 1 puff into the lungs every 6 (six) hours as needed for wheezing or shortness of breath.    Historical Provider, MD  morphine (MSIR) 15 MG tablet Take 1 tablet (15 mg total) by mouth every 4 (four) hours as needed for severe pain. 01/14/16   Deno Etienne, DO    Family History Family History  Problem Relation Age of Onset  . Allergies Sister   . Rheum arthritis Mother   . Rheum arthritis Sister     Social History Social History  Substance Use Topics  . Smoking status: Never Smoker  . Smokeless tobacco: Never Used  . Alcohol use No     Allergies   Patient has no known allergies.   Review of Systems Review of Systems  Constitutional: Negative for chills and fever.  HENT: Negative for congestion and rhinorrhea.   Eyes: Negative for redness and visual disturbance.  Respiratory: Negative for shortness of breath and wheezing.   Cardiovascular: Negative for chest pain and palpitations.  Gastrointestinal: Negative for nausea and vomiting.  Genitourinary: Negative for dysuria and urgency.  Musculoskeletal: Positive for arthralgias and myalgias.  Skin: Negative for pallor and wound.  Neurological: Negative for dizziness and headaches.     Physical Exam Updated Vital Signs BP 109/61   Pulse 80   Temp 98 F (36.7 C) (Oral)   Resp Marland Kitchen)  49   SpO2 96%   Physical Exam  Constitutional: She is oriented to person, place, and time. She appears well-developed and well-nourished. No distress.  HENT:  Head: Normocephalic and atraumatic.  Eyes: EOM are normal. Pupils are equal, round, and reactive to light.  Neck: Normal range of motion. Neck supple.  Cardiovascular: Normal rate and regular rhythm.  Exam reveals no gallop and no friction rub.   No murmur heard. Pulmonary/Chest: Effort normal. She has no wheezes. She has no rales.  Abdominal: Soft. She exhibits no distension and no mass.  There is no tenderness. There is no guarding.  Musculoskeletal: She exhibits edema, tenderness and deformity.  Left elbow deformity, PMS intact distally.  No noted tenderness to the spine, no leg pain  Neurological: She is alert and oriented to person, place, and time.  Skin: Skin is warm and dry. She is not diaphoretic.  Psychiatric: She has a normal mood and affect. Her behavior is normal.  Nursing note and vitals reviewed.    ED Treatments / Results  Labs (all labs ordered are listed, but only abnormal results are displayed) Labs Reviewed - No data to display  EKG  EKG Interpretation None       Radiology Dg Elbow Complete Left  Result Date: 01/14/2016 CLINICAL DATA:  Left elbow pain, status post fall EXAM: LEFT ELBOW - COMPLETE 3+ VIEW COMPARISON:  None. FINDINGS: Posterior elbow dislocation. Tiny fracture suspected along the olecranon. Associated elbow joint effusion. IMPRESSION: Posterior elbow dislocation with associated tiny olecranon fracture. Elbow joint effusion. Electronically Signed   By: Julian Hy M.D.   On: 01/14/2016 16:58   Dg Forearm Left  Result Date: 01/14/2016 CLINICAL DATA:  Left elbow pain, status post fall EXAM: LEFT FOREARM - 2 VIEW COMPARISON:  None. FINDINGS: Elbow fracture/ dislocation described separately. No fracture is seen in the mid/distal forearm. IMPRESSION: No fracture is seen in the mid/ distal forearm. Pertinent findings in the elbow are described separately. Electronically Signed   By: Julian Hy M.D.   On: 01/14/2016 17:00    Procedures .Sedation Date/Time: 01/14/2016 7:08 PM Performed by: Tyrone Nine Chandni Gagan Authorized by: Deno Etienne   Consent:    Consent obtained:  Written   Consent given by:  Patient   Risks discussed:  Allergic reaction, prolonged hypoxia resulting in organ damage, respiratory compromise necessitating ventilatory assistance and intubation, nausea, vomiting and inadequate sedation   Alternatives discussed:   Regional anesthesia Indications:    Procedure performed:  Dislocation reduction   Procedure necessitating sedation performed by:  Physician performing sedation   Intended level of sedation:  Moderate (conscious sedation) Pre-sedation assessment:    Time since last food or drink:  8   ASA classification: class 1 - normal, healthy patient     Neck mobility: normal     Mouth opening:  3 or more finger widths   Thyromental distance:  4 finger widths   Mallampati score:  I - soft palate, uvula, fauces, pillars visible   Pre-sedation assessments completed and reviewed: airway patency, cardiovascular function, hydration status, mental status, nausea/vomiting, pain level, respiratory function and temperature     History of difficult intubation: no   Immediate pre-procedure details:    Reviewed: vital signs and relevant labs/tests     Verified: bag valve mask available, emergency equipment available, intubation equipment available, IV patency confirmed, oxygen available and suction available   Procedure details (see MAR for exact dosages):    Preoxygenation:  Nasal cannula   Sedation:  Propofol   Analgesia:  Fentanyl   Intra-procedure monitoring:  Blood pressure monitoring, cardiac monitor, continuous capnometry, frequent LOC assessments, frequent vital sign checks and continuous pulse oximetry   Intra-procedure events: none   Post-procedure details:    Attendance: Constant attendance by certified staff until patient recovered     Recovery: Patient returned to pre-procedure baseline     Post-sedation assessments completed and reviewed: airway patency, cardiovascular function, hydration status, mental status, nausea/vomiting, pain level, respiratory function and temperature     Patient tolerance:  Tolerated well, no immediate complications Reduction of dislocation Date/Time: 01/14/2016 7:09 PM Performed by: Tyrone Nine Navayah Sok Authorized by: Deno Etienne  Consent: Verbal consent obtained. Risks and  benefits: risks, benefits and alternatives were discussed Consent given by: patient Patient understanding: patient states understanding of the procedure being performed Patient consent: the patient's understanding of the procedure matches consent given Procedure consent: procedure consent matches procedure scheduled Relevant documents: relevant documents present and verified Test results: test results available and properly labeled Site marked: the operative site was marked Imaging studies: imaging studies available Required items: required blood products, implants, devices, and special equipment available Patient identity confirmed: verbally with patient Time out: Immediately prior to procedure a "time out" was called to verify the correct patient, procedure, equipment, support staff and site/side marked as required. Local anesthesia used: yes Anesthesia: local infiltration  Anesthesia: Local anesthesia used: yes Local Anesthetic: lidocaine 2% without epinephrine Anesthetic total: 8 mL  Sedation: Patient sedated: yes Sedation type: moderate (conscious) sedation Sedatives: propofol Vitals: Vital signs were monitored during sedation. Patient tolerance: Patient tolerated the procedure well with no immediate complications Comments: Left posterior elbow, reduced with slight flexion and distraction    (including critical care time)  Medications Ordered in ED Medications  morphine 4 MG/ML injection 4 mg (4 mg Intravenous Given 01/14/16 1700)  ondansetron (ZOFRAN) injection 4 mg (4 mg Intravenous Given 01/14/16 1700)  lidocaine (XYLOCAINE) 2 % (with pres) injection 200 mg (200 mg Other Given 01/14/16 1823)  propofol (DIPRIVAN) 10 mg/mL bolus/IV push 60 mg (60 mg Intravenous Given 01/14/16 1830)  fentaNYL (SUBLIMAZE) injection 100 mcg (100 mcg Intravenous Given 01/14/16 1828)     Initial Impression / Assessment and Plan / ED Course  I have reviewed the triage vital signs and the  nursing notes.  Pertinent labs & imaging results that were available during my care of the patient were reviewed by me and considered in my medical decision making (see chart for details).  Clinical Course     55 yo F With a chief complaint of left elbow pain. Clinically appears dislocated. Confirmed on x-ray. Relocate.  The patient is requesting to follow-up at [facility]. Given the number of one of the orthopedic surgeons there. Also given our hand surgeon in case she is unable to get an appointment. Placed in a splint. Discharge home.  7:10 PM:  I have discussed the diagnosis/risks/treatment options with the patient and believe the pt to be eligible for discharge home to follow-up with Ortho. We also discussed returning to the ED immediately if new or worsening sx occur. We discussed the sx which are most concerning (e.g., sudden worsening pain, numbness, tingling) that necessitate immediate return. Medications administered to the patient during their visit and any new prescriptions provided to the patient are listed below.  Medications given during this visit Medications  morphine 4 MG/ML injection 4 mg (4 mg Intravenous Given 01/14/16 1700)  ondansetron (ZOFRAN) injection 4 mg (4 mg Intravenous Given  01/14/16 1700)  lidocaine (XYLOCAINE) 2 % (with pres) injection 200 mg (200 mg Other Given 01/14/16 1823)  propofol (DIPRIVAN) 10 mg/mL bolus/IV push 60 mg (60 mg Intravenous Given 01/14/16 1830)  fentaNYL (SUBLIMAZE) injection 100 mcg (100 mcg Intravenous Given 01/14/16 1828)     The patient appears reasonably screen and/or stabilized for discharge and I doubt any other medical condition or other [facility] requiring further screening, evaluation, or treatment in the ED at this time prior to discharge.    Final Clinical Impressions(s) / ED Diagnoses   Final diagnoses:  Closed posterior dislocation of left elbow, initial encounter    New Prescriptions New Prescriptions   MORPHINE (MSIR)  15 MG TABLET    Take 1 tablet (15 mg total) by mouth every 4 (four) hours as needed for severe pain.     Deno Etienne, DO 01/14/16 1910

## 2016-01-14 NOTE — ED Notes (Signed)
Elbow back in place. Pt vitals stable. Ortho tech at bedside placing splint.

## 2018-03-06 ENCOUNTER — Encounter: Payer: Self-pay | Admitting: Family Medicine

## 2018-03-06 ENCOUNTER — Ambulatory Visit (INDEPENDENT_AMBULATORY_CARE_PROVIDER_SITE_OTHER): Payer: 59 | Admitting: Family Medicine

## 2018-03-06 VITALS — BP 110/60 | HR 81 | Ht <= 58 in | Wt 152.4 lb

## 2018-03-06 DIAGNOSIS — E559 Vitamin D deficiency, unspecified: Secondary | ICD-10-CM | POA: Diagnosis not present

## 2018-03-06 DIAGNOSIS — M25649 Stiffness of unspecified hand, not elsewhere classified: Secondary | ICD-10-CM | POA: Diagnosis not present

## 2018-03-06 DIAGNOSIS — Z131 Encounter for screening for diabetes mellitus: Secondary | ICD-10-CM | POA: Insufficient documentation

## 2018-03-06 DIAGNOSIS — Z0000 Encounter for general adult medical examination without abnormal findings: Secondary | ICD-10-CM | POA: Insufficient documentation

## 2018-03-06 NOTE — Progress Notes (Signed)
Established Patient Office Visit  Subjective:  Patient ID: Stacey Weiss, female    DOB: 05/27/1960  Age: 58 y.o. MRN: [id number]  CC:  Chief Complaint  Patient presents with  . Establish Care  . Annual Exam    HPI [name] presents for establishment of care and physical exam.  She is accompanied by her husband.  She is nonfasting today.  She works as an Therapist, sports in [employer].  She has no children of her own.  She does have a 25 year old stepson.  Patient does not smoke drink alcohol or use illicit drugs.  Her father lived up into his 1s.  Mom is 43 and still living.  One sister and mother have rheumatoid arthritis.  Patient is status post TAH for uterine leiomyoma.  Mammogram was 2 years ago.  She has never had a colonoscopy.  History of vitamin D deficiency.  She exercises in the morning stretching aerobicly she tells me.  Patient has been experiencing morning stiffness in her hands.  Past Medical History:  Diagnosis Date  . Blepharospasm 05/17/2013  . Fibroid   . Leiomyoma of uterus, unspecified     Past Surgical History:  Procedure Laterality Date  . ABDOMINAL HYSTERECTOMY N/A 04/17/2013   Procedure: HYSTERECTOMY ABDOMINAL BILATERAL SALPINGECTOMY Through Vertical Incision;  Surgeon: Princess Bruins, MD;  Location: [facility];  Service: Gynecology;  Laterality: N/A;  . BILATERAL SALPINGECTOMY      Family History  Problem Relation Age of Onset  . Allergies Sister   . Rheum arthritis Mother   . Rheum arthritis Sister     Social History   Socioeconomic History  . Marital status: Married    Spouse name: Not on file  . Number of children: 0  . Years of education: college  . Highest education level: Not on file  Occupational History  . Occupation: Forensic psychologist: [employer]    Comment: Part-Time  Social Needs  . Financial resource strain: Not on file  . Food insecurity:    Worry: Not on file    Inability: Not on file  . Transportation  needs:    Medical: Not on file    Non-medical: Not on file  Tobacco Use  . Smoking status: Never Smoker  . Smokeless tobacco: Never Used  Substance and Sexual Activity  . Alcohol use: No  . Drug use: No  . Sexual activity: Yes    Birth control/protection: None  Lifestyle  . Physical activity:    Days per week: Not on file    Minutes per session: Not on file  . Stress: Not on file  Relationships  . Social connections:    Talks on phone: Not on file    Gets together: Not on file    Attends religious service: Not on file    Active member of club or organization: Not on file    Attends meetings of clubs or organizations: Not on file    Relationship status: Not on file  . Intimate partner violence:    Fear of current or ex partner: Not on file    Emotionally abused: Not on file    Physically abused: Not on file    Forced sexual activity: Not on file  Other Topics Concern  . Not on file  Social History Narrative  . Not on file    Outpatient Medications Prior to Visit  Medication Sig Dispense Refill  . albuterol (PROVENTIL HFA;VENTOLIN HFA) 108 (90 BASE) MCG/ACT  inhaler Inhale 1 puff into the lungs every 6 (six) hours as needed for wheezing or shortness of breath.    . cholecalciferol (VITAMIN D) 1000 units tablet Take 1,000 Units by mouth daily.    Marland Kitchen morphine (MSIR) 15 MG tablet Take 1 tablet (15 mg total) by mouth every 4 (four) hours as needed for severe pain. 5 tablet 0   No facility-administered medications prior to visit.     No Known Allergies  ROS Review of Systems  Constitutional: Negative for chills, diaphoresis, fatigue, fever and unexpected weight change.  HENT: Negative.   Eyes: Negative for photophobia and visual disturbance.  Respiratory: Negative.   Cardiovascular: Negative.   Endocrine: Negative for polyphagia and polyuria.  Genitourinary: Negative.   Musculoskeletal: Positive for arthralgias.  Skin: Negative for pallor and rash.    Allergic/Immunologic: Negative for immunocompromised state.  Neurological: Negative for light-headedness and headaches.  Hematological: Does not bruise/bleed easily.  Psychiatric/Behavioral: Negative.       Objective:    Physical Exam  Constitutional: She is oriented to person, place, and time. She appears well-developed and well-nourished. No distress.  HENT:  Head: Normocephalic and atraumatic.  Right Ear: External ear normal.  Left Ear: External ear normal.  Mouth/Throat: Oropharynx is clear and moist. No oropharyngeal exudate.  Eyes: Pupils are equal, round, and reactive to light. Conjunctivae are normal. Right eye exhibits no discharge. Left eye exhibits no discharge. No scleral icterus.  Neck: Neck supple. No JVD present. No tracheal deviation present. No thyromegaly present.  Cardiovascular: Normal rate, regular rhythm and normal heart sounds.  Pulmonary/Chest: Effort normal and breath sounds normal. No stridor.  Abdominal: Bowel sounds are normal.  Musculoskeletal:     Right hand: Normal.     Left hand: Normal.  Lymphadenopathy:    She has no cervical adenopathy.  Neurological: She is alert and oriented to person, place, and time.  Skin: Skin is warm and dry. She is not diaphoretic.  Psychiatric: She has a normal mood and affect. Her behavior is normal.    BP 110/60   Pulse 81   Ht 4' 9.5" (1.461 m)   Wt 152 lb 6 oz (69.1 kg)   SpO2 99%   BMI 32.40 kg/m  Wt Readings from Last 3 Encounters:  03/06/18 152 lb 6 oz (69.1 kg)  05/17/13 140 lb (63.5 kg)  04/17/13 138 lb 14.2 oz (63 kg)   BP Readings from Last 3 Encounters:  03/06/18 110/60  01/14/16 128/88  05/17/13 109/67   Guideline developer:  UpToDate (see UpToDate for funding source) Date Released: June 2014  Health Maintenance Due  Topic Date Due  . HIV Screening  01/11/1976  . COLONOSCOPY  01/11/2011  . MAMMOGRAM  04/06/2015    There are no preventive care reminders to display for this  patient.  No results found for: TSH Lab Results  Component Value Date   WBC 7.0 04/18/2013   HGB 11.7 (L) 04/18/2013   HCT 34.0 (L) 04/18/2013   MCV 93.4 04/18/2013   PLT 170 04/18/2013   Lab Results  Component Value Date   NA 137 07/23/2012   K 3.8 07/23/2012   CO2 25 07/23/2012   GLUCOSE 85 07/23/2012   BUN 15 07/23/2012   CREATININE 1.15 (H) 07/23/2012   BILITOT 0.3 07/23/2012   ALKPHOS 56 07/23/2012   AST 25 07/23/2012   ALT 14 07/23/2012   PROT 7.8 07/23/2012   ALBUMIN 3.8 07/23/2012   CALCIUM 9.4 07/23/2012   No results  found for: CHOL No results found for: HDL No results found for: LDLCALC No results found for: TRIG No results found for: CHOLHDL No results found for: HGBA1C    Assessment & Plan:   Problem List Items Addressed This Visit      Other   Health care maintenance - Primary   Relevant Orders   MM Digital Screening   Comprehensive metabolic panel   CBC   HIV Antibody (routine testing w rflx)   Lipid panel   TSH   Urinalysis, Routine w reflex microscopic   Ambulatory referral to Gastroenterology   Joint stiffness of hand, unspecified laterality   Relevant Orders   Rheumatoid Factor   Vitamin D deficiency   Relevant Orders   VITAMIN D 25 Hydroxy (Vit-D Deficiency, Fractures)      No orders of the defined types were placed in this encounter.   Follow-up: Return in about 6 months (around 09/04/2018).   Patient will return tomorrow morning fasting for above ordered blood work.  He was given information on health maintenance for postmenopausal women.  With her history of elevated cholesterol she was given information on lifestyle changes to lower her cholesterol.  He was also given information on exercising to lose weight

## 2018-03-06 NOTE — Patient Instructions (Signed)
Health Maintenance for Postmenopausal Women Menopause is a normal process in which your reproductive ability comes to an end. This process happens gradually over a span of months to years, usually between the ages of 62 and 89. Menopause is complete when you have missed 12 consecutive menstrual periods. It is important to talk with your health care provider about some of the most common conditions that affect postmenopausal women, such as heart disease, cancer, and bone loss (osteoporosis). Adopting a healthy lifestyle and getting preventive care can help to promote your health and wellness. Those actions can also lower your chances of developing some of these common conditions. What should I know about menopause? During menopause, you may experience a number of symptoms, such as:  Moderate-to-severe hot flashes.  Night sweats.  Decrease in sex drive.  Mood swings.  Headaches.  Tiredness.  Irritability.  Memory problems.  Insomnia. Choosing to treat or not to treat menopausal changes is an individual decision that you make with your health care provider. What should I know about hormone replacement therapy and supplements? Hormone therapy products are effective for treating symptoms that are associated with menopause, such as hot flashes and night sweats. Hormone replacement carries certain risks, especially as you become older. If you are thinking about using estrogen or estrogen with progestin treatments, discuss the benefits and risks with your health care provider. What should I know about heart disease and stroke? Heart disease, heart attack, and stroke become more likely as you age. This may be due, in part, to the hormonal changes that your body experiences during menopause. These can affect how your body processes dietary fats, triglycerides, and cholesterol. Heart attack and stroke are both medical emergencies. There are many things that you can do to help prevent heart disease  and stroke:  Have your blood pressure checked at least every 1-2 years. High blood pressure causes heart disease and increases the risk of stroke.  If you are 79-72 years old, ask your health care provider if you should take aspirin to prevent a heart attack or a stroke.  Do not use any tobacco products, including cigarettes, chewing tobacco, or electronic cigarettes. If you need help quitting, ask your health care provider.  It is important to eat a healthy diet and maintain a healthy weight. ? Be sure to include plenty of vegetables, fruits, low-fat dairy products, and lean protein. ? Avoid eating foods that are high in solid fats, added sugars, or salt (sodium).  Get regular exercise. This is one of the most important things that you can do for your health. ? Try to exercise for at least 150 minutes each week. The type of exercise that you do should increase your heart rate and make you sweat. This is known as moderate-intensity exercise. ? Try to do strengthening exercises at least twice each week. Do these in addition to the moderate-intensity exercise.  Know your numbers.Ask your health care provider to check your cholesterol and your blood glucose. Continue to have your blood tested as directed by your health care provider.  What should I know about cancer screening? There are several types of cancer. Take the following steps to reduce your risk and to catch any cancer development as early as possible. Breast Cancer  Practice breast self-awareness. ? This means understanding how your breasts normally appear and feel. ? It also means doing regular breast self-exams. Let your health care provider know about any changes, no matter how small.  If you are 40 or  older, have a clinician do a breast exam (clinical breast exam or CBE) every year. Depending on your age, family history, and medical history, it may be recommended that you also have a yearly breast X-ray (mammogram).  If you  have a family history of breast cancer, talk with your health care provider about genetic screening.  If you are at high risk for breast cancer, talk with your health care provider about having an MRI and a mammogram every year.  Breast cancer (BRCA) gene test is recommended for women who have family members with BRCA-related cancers. Results of the assessment will determine the need for genetic counseling and BRCA1 and for BRCA2 testing. BRCA-related cancers include these types: ? Breast. This occurs in males or females. ? Ovarian. ? Tubal. This may also be called fallopian tube cancer. ? Cancer of the abdominal or pelvic lining (peritoneal cancer). ? Prostate. ? Pancreatic. Cervical, Uterine, and Ovarian Cancer Your health care provider may recommend that you be screened regularly for cancer of the pelvic organs. These include your ovaries, uterus, and vagina. This screening involves a pelvic exam, which includes checking for microscopic changes to the surface of your cervix (Pap test).  For women ages 21-65, health care providers may recommend a pelvic exam and a Pap test every three years. For women ages 39-65, they may recommend the Pap test and pelvic exam, combined with testing for human papilloma virus (HPV), every five years. Some types of HPV increase your risk of cervical cancer. Testing for HPV may also be done on women of any age who have unclear Pap test results.  Other health care providers may not recommend any screening for nonpregnant women who are considered low risk for pelvic cancer and have no symptoms. Ask your health care provider if a screening pelvic exam is right for you.  If you have had past treatment for cervical cancer or a condition that could lead to cancer, you need Pap tests and screening for cancer for at least 20 years after your treatment. If Pap tests have been discontinued for you, your risk factors (such as having a new sexual partner) need to be reassessed  to determine if you should start having screenings again. Some women have medical problems that increase the chance of getting cervical cancer. In these cases, your health care provider may recommend that you have screening and Pap tests more often.  If you have a family history of uterine cancer or ovarian cancer, talk with your health care provider about genetic screening.  If you have vaginal bleeding after reaching menopause, tell your health care provider.  There are currently no reliable tests available to screen for ovarian cancer. Lung Cancer Lung cancer screening is recommended for adults 57-50 years old who are at high risk for lung cancer because of a history of smoking. A yearly low-dose CT scan of the lungs is recommended if you:  Currently smoke.  Have a history of at least 30 pack-years of smoking and you currently smoke or have quit within the past 15 years. A pack-year is smoking an average of one pack of cigarettes per day for one year. Yearly screening should:  Continue until it has been 15 years since you quit.  Stop if you develop a health problem that would prevent you from having lung cancer treatment. Colorectal Cancer  This type of cancer can be detected and can often be prevented.  Routine colorectal cancer screening usually begins at age 12 and continues through  age 75.  If you have risk factors for colon cancer, your health care provider may recommend that you be screened at an earlier age.  If you have a family history of colorectal cancer, talk with your health care provider about genetic screening.  Your health care provider may also recommend using home test kits to check for hidden blood in your stool.  A small camera at the end of a tube can be used to examine your colon directly (sigmoidoscopy or colonoscopy). This is done to check for the earliest forms of colorectal cancer.  Direct examination of the colon should be repeated every 5-10 years until  age 75. However, if early forms of precancerous polyps or small growths are found or if you have a family history or genetic risk for colorectal cancer, you may need to be screened more often. Skin Cancer  Check your skin from head to toe regularly.  Monitor any moles. Be sure to tell your health care provider: ? About any new moles or changes in moles, especially if there is a change in a mole's shape or color. ? If you have a mole that is larger than the size of a pencil eraser.  If any of your family members has a history of skin cancer, especially at a young age, talk with your health care provider about genetic screening.  Always use sunscreen. Apply sunscreen liberally and repeatedly throughout the day.  Whenever you are outside, protect yourself by wearing long sleeves, pants, a wide-brimmed hat, and sunglasses. What should I know about osteoporosis? Osteoporosis is a condition in which bone destruction happens more quickly than new bone creation. After menopause, you may be at an increased risk for osteoporosis. To help prevent osteoporosis or the bone fractures that can happen because of osteoporosis, the following is recommended:  If you are 19-50 years old, get at least 1,000 mg of calcium and at least 600 mg of vitamin D per day.  If you are older than age 50 but younger than age 70, get at least 1,200 mg of calcium and at least 600 mg of vitamin D per day.  If you are older than age 70, get at least 1,200 mg of calcium and at least 800 mg of vitamin D per day. Smoking and excessive alcohol intake increase the risk of osteoporosis. Eat foods that are rich in calcium and vitamin D, and do weight-bearing exercises several times each week as directed by your health care provider. What should I know about how menopause affects my mental health? Depression may occur at any age, but it is more common as you become older. Common symptoms of depression include:  Low or sad  mood.  Changes in sleep patterns.  Changes in appetite or eating patterns.  Feeling an overall lack of motivation or enjoyment of activities that you previously enjoyed.  Frequent crying spells. Talk with your health care provider if you think that you are experiencing depression. What should I know about immunizations? It is important that you get and maintain your immunizations. These include:  Tetanus, diphtheria, and pertussis (Tdap) booster vaccine.  Influenza every year before the flu season begins.  Pneumonia vaccine.  Shingles vaccine. Your health care provider may also recommend other immunizations. This information is not intended to replace advice given to you by your health care provider. Make sure you discuss any questions you have with your health care provider. Document Released: 03/05/2005 Document Revised: 08/01/2015 Document Reviewed: 10/15/2014 Elsevier Interactive Patient Education    2019 [place].  Preventing High Cholesterol Cholesterol is a waxy, fat-like substance that your body needs in small amounts. Your liver makes all the cholesterol that your body needs. Having high cholesterol (hypercholesterolemia) increases your risk for heart disease and stroke. Extra (excess) cholesterol comes from the food you eat, such as animal-based fat (saturated fat) from meat and some dairy products. High cholesterol can often be prevented with diet and lifestyle changes. If you already have high cholesterol, you can control it with diet and lifestyle changes, as well as medicine. What nutrition changes can be made?  Eat less saturated fat. Foods that contain saturated fat include red meat and some dairy products.  Avoid processed meats, like bacon and lunch meats.  Avoid trans fats, which are found in margarine and some baked goods.  Avoid foods and beverages that have added sugars.  Eat more fruits, vegetables, and whole grains.  Choose healthy sources of  protein, such as fish, poultry, and nuts.  Choose healthy sources of fat, such as: ? Nuts. ? Vegetable oils, especially olive oil. ? Fish that have healthy fats (omega-3 fatty acids), such as mackerel or salmon. What lifestyle changes can be made?   Lose weight if you are overweight. Losing 5-10 lb (2.3-4.5 kg) can help prevent or control high cholesterol and reduce your risk for diabetes and high blood pressure. Ask your health care provider to help you with a diet and exercise plan to safely lose weight.  Get enough exercise. Do at least 150 minutes of moderate-intensity exercise each week. ? You could do this in short exercise sessions several times a day, or you could do longer exercise sessions a few times a week. For example, you could take a brisk 10-minute walk or bike ride, 3 times a day, for 5 days a week.  Do not smoke. If you need help quitting, ask your health care provider.  Limit your alcohol intake. If you drink alcohol, limit alcohol intake to no more than 1 drink a day for nonpregnant women and 2 drinks a day for men. One drink equals 12 oz of beer, 5 oz of wine, or 1 oz of hard liquor. Why are these changes important?  If you have high cholesterol, deposits (plaques) may build up on the walls of your blood vessels. Plaques make the arteries narrower and stiffer, which can restrict or block blood flow and cause blood clots to form. This greatly increases your risk for heart attack and stroke. Making diet and lifestyle changes can reduce your risk for these life-threatening conditions. What can I do to lower my risk?  Manage your risk factors for high cholesterol. Talk with your health care provider about all of your risk factors and how to lower your risk.  Manage other conditions that you have, such as diabetes or high blood pressure (hypertension).  Have your cholesterol checked at regular intervals.  Keep all follow-up visits as told by your health care provider.  This is important. How is this treated? In addition to diet and lifestyle changes, your health care provider may recommend medicines to help lower cholesterol, such as a medicine to reduce the amount of cholesterol made in your liver. You may need medicine if:  Diet and lifestyle changes do not lower your cholesterol enough.  You have high cholesterol and other risk factors for heart disease or stroke. Take over-the-counter and prescription medicines only as told by your health care provider. Where to find more information  American Heart Association:  ThisTune.com.pt.jsp  National Heart, Lung, and Blood Institute: [employer]Toilet[employer].com.cy Summary  High cholesterol increases your risk for heart disease and stroke. By keeping your cholesterol level low, you can reduce your risk for these conditions.  Diet and lifestyle changes are the most important steps in preventing high cholesterol.  Work with your health care provider to manage your risk factors, and have your blood tested regularly. This information is not intended to replace advice given to you by your health care provider. Make sure you discuss any questions you have with your health care provider. Document Released: 01/26/2015 Document Revised: 09/20/2015 Document Reviewed: 09/20/2015 Elsevier Interactive Patient Education  2019 [employer].  Exercising to [employer] Exercise is structured, repetitive physical activity to improve fitness and health. Getting regular exercise is important for everyone. It is especially important if you are overweight. Being overweight increases your risk of heart disease, stroke, diabetes, high blood pressure, and several types of cancer. Reducing your calorie intake and exercising can help you lose weight. Exercise is usually categorized as moderate or vigorous intensity. To lose weight,  most people need to do a certain amount of moderate-intensity or vigorous-intensity exercise each week. Moderate-intensity exercise  Moderate-intensity exercise is any activity that gets you moving enough to burn at least three times more energy (calories) than if you were sitting. Examples of moderate exercise include:  Walking a mile in 15 minutes.  Doing light yard work.  Biking at an easy pace. Most people should get at least 150 minutes (2 hours and 30 minutes) a week of moderate-intensity exercise to maintain their body weight. Vigorous-intensity exercise Vigorous-intensity exercise is any activity that gets you moving enough to burn at least six times more calories than if you were sitting. When you exercise at this intensity, you should be working hard enough that you are not able to carry on a conversation. Examples of vigorous exercise include:  Running.  Playing a team sport, such as football, basketball, and soccer.  Jumping rope. Most people should get at least 75 minutes (1 hour and 15 minutes) a week of vigorous-intensity exercise to maintain their body weight. How can exercise affect me? When you exercise enough to burn more calories than you eat, you lose weight. Exercise also reduces body fat and builds muscle. The more muscle you have, the more calories you burn. Exercise also:  Improves mood.  Reduces stress and tension.  Improves your overall fitness, flexibility, and endurance.  Increases bone strength. The amount of exercise you need to lose weight depends on:  Your age.  The type of exercise.  Any health conditions you have.  Your overall physical ability. Talk to your health care provider about how much exercise you need and what types of activities are safe for you. What actions can I take to lose weight? Nutrition   Make changes to your diet as told by your health care provider or diet and nutrition specialist (dietitian). This may  include: ? Eating fewer calories. ? Eating more protein. ? Eating less unhealthy fats. ? Eating a diet that includes fresh fruits and vegetables, whole grains, low-fat dairy products, and lean protein. ? Avoiding foods with added fat, salt, and sugar.  Drink plenty of water while you exercise to prevent dehydration or heat stroke. Activity  Choose an activity that you enjoy and set realistic goals. Your health care provider can help you make an exercise plan that works for you.  Exercise at a moderate or vigorous intensity most days of  the week. ? The intensity of exercise may vary from person to person. You can tell how intense a workout is for you by paying attention to your breathing and heartbeat. Most people will notice their breathing and heartbeat get faster with more intense exercise.  Do resistance training twice each week, such as: ? Push-ups. ? Sit-ups. ? Lifting weights. ? Using resistance bands.  Getting short amounts of exercise can be just as helpful as long structured periods of exercise. If you have trouble finding time to exercise, try to include exercise in your daily routine. ? Get up, stretch, and walk around every 30 minutes throughout the day. ? Go for a walk during your lunch break. ? Park your car farther away from your destination. ? If you take public transportation, get off one stop early and walk the rest of the way. ? Make phone calls while standing up and walking around. ? Take the stairs instead of elevators or escalators.  Wear comfortable clothes and shoes with good support.  Do not exercise so much that you hurt yourself, feel dizzy, or get very short of breath. Where to find more information  [place] Department of Health and Human Services: [employer].at  Centers for Disease Control and Prevention (CDC): http://www.wolf.info/ Contact a health care provider:  Before starting a new exercise program.  If you have questions or concerns about your  weight.  If you have a medical problem that keeps you from exercising. Get help right away if you have any of the following while exercising:  Injury.  Dizziness.  Difficulty breathing or shortness of breath that does not go away when you stop exercising.  Chest pain.  Rapid heartbeat. Summary  Being overweight increases your risk of heart disease, stroke, diabetes, high blood pressure, and several types of cancer.  Losing weight happens when you burn more calories than you eat.  Reducing the amount of calories you eat in addition to getting regular moderate or vigorous exercise each week helps you lose weight. This information is not intended to replace advice given to you by your health care provider. Make sure you discuss any questions you have with your health care provider. Document Released: 02/13/2010 Document Revised: 01/24/2017 Document Reviewed: 01/24/2017 Elsevier Interactive Patient Education  2019 [employer].

## 2018-03-07 ENCOUNTER — Other Ambulatory Visit (INDEPENDENT_AMBULATORY_CARE_PROVIDER_SITE_OTHER): Payer: 59

## 2018-03-07 DIAGNOSIS — Z0000 Encounter for general adult medical examination without abnormal findings: Secondary | ICD-10-CM | POA: Diagnosis not present

## 2018-03-07 DIAGNOSIS — E559 Vitamin D deficiency, unspecified: Secondary | ICD-10-CM

## 2018-03-07 DIAGNOSIS — M25649 Stiffness of unspecified hand, not elsewhere classified: Secondary | ICD-10-CM

## 2018-03-07 LAB — COMPREHENSIVE METABOLIC PANEL
ALBUMIN: 4.2 g/dL (ref 3.5–5.2)
ALK PHOS: 65 U/L (ref 39–117)
ALT: 13 U/L (ref 0–35)
AST: 17 U/L (ref 0–37)
BILIRUBIN TOTAL: 0.3 mg/dL (ref 0.2–1.2)
BUN: 17 mg/dL (ref 6–23)
CALCIUM: 9.5 mg/dL (ref 8.4–10.5)
CO2: 25 mEq/L (ref 19–32)
CREATININE: 0.75 mg/dL (ref 0.40–1.20)
Chloride: 106 mEq/L (ref 96–112)
GFR: 96.32 mL/min (ref >60.00)
Glucose, Bld: 87 mg/dL (ref 70–99)
Potassium: 4.1 mEq/L (ref 3.5–5.1)
Sodium: 140 mEq/L (ref 135–145)
TOTAL PROTEIN: 7.2 g/dL (ref 6.0–8.3)

## 2018-03-07 LAB — URINALYSIS, ROUTINE W REFLEX MICROSCOPIC
BILIRUBIN URINE: NEGATIVE
HGB URINE DIPSTICK: NEGATIVE
Ketones, ur: NEGATIVE
NITRITE: NEGATIVE
Specific Gravity, Urine: 1.025 (ref 1.000–1.030)
TOTAL PROTEIN, URINE-UPE24: NEGATIVE
Urine Glucose: NEGATIVE
Urobilinogen, UA: 0.2 (ref 0.0–1.0)
pH: 6 (ref 5.0–8.0)

## 2018-03-07 LAB — LIPID PANEL
Cholesterol: 236 mg/dL — ABNORMAL HIGH (ref 0–200)
HDL: 49.3 mg/dL (ref >39.00)
LDL Cholesterol: 169 mg/dL — ABNORMAL HIGH (ref 0–99)
NONHDL: 186.48
Total CHOL/HDL Ratio: 5
Triglycerides: 89 mg/dL (ref 0.0–149.0)
VLDL: 17.8 mg/dL (ref 0.0–40.0)

## 2018-03-07 LAB — CBC
HCT: 40.7 % (ref 36.0–46.0)
HEMOGLOBIN: 13.5 g/dL (ref 12.0–15.0)
MCHC: 33.2 g/dL (ref 30.0–36.0)
MCV: 94.2 fl (ref 78.0–100.0)
PLATELETS: 218 10*3/uL (ref 150.0–400.0)
RBC: 4.32 Mil/uL (ref 3.87–5.11)
RDW: 13.1 % (ref 11.5–15.5)
WBC: 4.7 10*3/uL (ref 4.0–10.5)

## 2018-03-07 LAB — VITAMIN D 25 HYDROXY (VIT D DEFICIENCY, FRACTURES): VITD: 34.3 ng/mL (ref 30.00–100.00)

## 2018-03-07 LAB — TSH: TSH: 1.75 u[IU]/mL (ref 0.35–4.50)

## 2018-03-08 LAB — HIV ANTIBODY (ROUTINE TESTING W REFLEX): HIV: NONREACTIVE

## 2018-03-08 LAB — RHEUMATOID FACTOR

## 2018-04-06 ENCOUNTER — Encounter: Payer: Self-pay | Admitting: Family Medicine

## 2019-06-27 ENCOUNTER — Encounter: Payer: 59 | Admitting: Family Medicine

## 2019-07-04 ENCOUNTER — Other Ambulatory Visit: Payer: Self-pay

## 2019-07-04 ENCOUNTER — Encounter: Payer: Self-pay | Admitting: Family Medicine

## 2019-07-04 ENCOUNTER — Encounter: Payer: 59 | Admitting: Family Medicine

## 2019-07-04 ENCOUNTER — Ambulatory Visit (INDEPENDENT_AMBULATORY_CARE_PROVIDER_SITE_OTHER): Payer: 59 | Admitting: Family Medicine

## 2019-07-04 VITALS — BP 112/64 | HR 70 | Temp 97.4°F | Ht <= 58 in | Wt 158.0 lb

## 2019-07-04 DIAGNOSIS — E7800 Pure hypercholesterolemia, unspecified: Secondary | ICD-10-CM | POA: Diagnosis not present

## 2019-07-04 DIAGNOSIS — Z0000 Encounter for general adult medical examination without abnormal findings: Secondary | ICD-10-CM | POA: Diagnosis not present

## 2019-07-04 LAB — LDL CHOLESTEROL, DIRECT: Direct LDL: 155 mg/dL

## 2019-07-04 LAB — COMPREHENSIVE METABOLIC PANEL
ALT: 12 U/L (ref 0–35)
AST: 16 U/L (ref 0–37)
Albumin: 4.2 g/dL (ref 3.5–5.2)
Alkaline Phosphatase: 62 U/L (ref 39–117)
BUN: 12 mg/dL (ref 6–23)
CO2: 28 mEq/L (ref 19–32)
Calcium: 9.1 mg/dL (ref 8.4–10.5)
Chloride: 106 mEq/L (ref 96–112)
Creatinine, Ser: 0.68 mg/dL (ref 0.40–1.20)
GFR: 107.35 mL/min (ref >60.00)
Glucose, Bld: 94 mg/dL (ref 70–99)
Potassium: 4.2 mEq/L (ref 3.5–5.1)
Sodium: 140 mEq/L (ref 135–145)
Total Bilirubin: 0.5 mg/dL (ref 0.2–1.2)
Total Protein: 6.9 g/dL (ref 6.0–8.3)

## 2019-07-04 LAB — LIPID PANEL
Cholesterol: 230 mg/dL — ABNORMAL HIGH (ref 0–200)
HDL: 49.3 mg/dL (ref >39.00)
LDL Cholesterol: 168 mg/dL — ABNORMAL HIGH (ref 0–99)
NonHDL: 181.01
Total CHOL/HDL Ratio: 5
Triglycerides: 63 mg/dL (ref 0.0–149.0)
VLDL: 12.6 mg/dL (ref 0.0–40.0)

## 2019-07-04 LAB — URINALYSIS, ROUTINE W REFLEX MICROSCOPIC
Bilirubin Urine: NEGATIVE
Hgb urine dipstick: NEGATIVE
Ketones, ur: NEGATIVE
Nitrite: NEGATIVE
RBC / HPF: NONE SEEN (ref 0–?)
Specific Gravity, Urine: 1.01 (ref 1.000–1.030)
Total Protein, Urine: NEGATIVE
Urine Glucose: NEGATIVE
Urobilinogen, UA: 0.2 (ref 0.0–1.0)
pH: 7 (ref 5.0–8.0)

## 2019-07-04 LAB — CBC
HCT: 38.9 % (ref 36.0–46.0)
Hemoglobin: 12.9 g/dL (ref 12.0–15.0)
MCHC: 33.3 g/dL (ref 30.0–36.0)
MCV: 94.4 fl (ref 78.0–100.0)
Platelets: 219 10*3/uL (ref 150.0–400.0)
RBC: 4.12 Mil/uL (ref 3.87–5.11)
RDW: 12.9 % (ref 11.5–15.5)
WBC: 4.2 10*3/uL (ref 4.0–10.5)

## 2019-07-04 LAB — HEMOGLOBIN A1C: Hgb A1c MFr Bld: 6 % (ref 4.6–6.5)

## 2019-07-04 NOTE — Progress Notes (Addendum)
Established Patient Office Visit  Subjective:  Patient ID: Stacey Weiss, female    DOB: 1960-11-16  Age: 59 y.o. MRN: [id number]  CC:  Chief Complaint  Patient presents with  . Annual Exam    Patient is here today for a CPE. The last mammogram noted was 3.12.2015 she is overdue. She is also due for Colonoscopy. S/P Hysterectomy.  Pt is currently fasting. Pt declines mammogram and Colonoscopy. Denies any current issues.    HPI Stacey Weiss presents for yearly physical.  She has lowered the fat and cholesterol in her diet.  She is due for mammogram and colonoscopy.  She has seen the dentist twice yearly.  She has had her Covid vaccines.  She continues to work as an Therapist, sports.  She is here today with her husband.  Past Medical History:  Diagnosis Date  . Blepharospasm 05/17/2013  . Fibroid   . Leiomyoma of uterus, unspecified     Past Surgical History:  Procedure Laterality Date  . ABDOMINAL HYSTERECTOMY N/A 04/17/2013   Procedure: HYSTERECTOMY ABDOMINAL BILATERAL SALPINGECTOMY Through Vertical Incision;  Surgeon: Princess Bruins, MD;  Location: [facility];  Service: Gynecology;  Laterality: N/A;  . BILATERAL SALPINGECTOMY      Family History  Problem Relation Age of Onset  . Allergies Sister   . Rheum arthritis Mother   . Rheum arthritis Sister     Social History   Socioeconomic History  . Marital status: Married    Spouse name: Not on file  . Number of children: 0  . Years of education: college  . Highest education level: Not on file  Occupational History  . Occupation: Forensic psychologist: [employer]    Comment: Part-Time  Tobacco Use  . Smoking status: Never Smoker  . Smokeless tobacco: Never Used  Substance and Sexual Activity  . Alcohol use: No  . Drug use: No  . Sexual activity: Yes    Birth control/protection: None  Other Topics Concern  . Not on file  Social History Narrative  . Not on file   Social Determinants of Health   Financial  Resource Strain:   . Difficulty of Paying Living Expenses:   Food Insecurity:   . Worried About Charity fundraiser in the Last Year:   . Arboriculturist in the Last Year:   Transportation Needs:   . Film/video editor (Medical):   Marland Kitchen Lack of Transportation (Non-Medical):   Physical Activity:   . Days of Exercise per Week:   . Minutes of Exercise per Session:   Stress:   . Feeling of Stress :   Social Connections:   . Frequency of Communication with Friends and Family:   . Frequency of Social Gatherings with Friends and Family:   . Attends Religious Services:   . Active Member of Clubs or Organizations:   . Attends Archivist Meetings:   Marland Kitchen Marital Status:   Intimate Partner Violence:   . Fear of Current or Ex-Partner:   . Emotionally Abused:   Marland Kitchen Physically Abused:   . Sexually Abused:     No outpatient medications prior to visit.   No facility-administered medications prior to visit.    No Known Allergies  ROS Review of Systems  Constitutional: Negative.   HENT: Negative.   Eyes: Negative for photophobia and visual disturbance.  Respiratory: Negative.   Cardiovascular: Negative.   Gastrointestinal: Negative.   Endocrine: Negative for polyphagia and polyuria.  Genitourinary: Negative.  Musculoskeletal: Negative for gait problem and joint swelling.  Skin: Negative for pallor and rash.  Allergic/Immunologic: Negative for immunocompromised state.  Neurological: Negative for tremors and speech difficulty.  Hematological: Does not bruise/bleed easily.  Psychiatric/Behavioral: Negative.       Objective:    Physical Exam  Constitutional: She is oriented to person, place, and time. She appears well-developed and well-nourished. No distress.  HENT:  Head: Normocephalic and atraumatic.  Right Ear: External ear normal.  Left Ear: External ear normal.  Eyes: Pupils are equal, round, and reactive to light. Conjunctivae are normal. Right eye exhibits no  discharge. Left eye exhibits no discharge. No scleral icterus.  Neck: No JVD present. No tracheal deviation present. No thyromegaly present.  Cardiovascular: Normal rate, regular rhythm and normal heart sounds.  Pulmonary/Chest: Effort normal and breath sounds normal. No stridor.  Abdominal: Bowel sounds are normal.  Musculoskeletal:        General: No edema.     Cervical back: Neck supple.  Lymphadenopathy:    She has no cervical adenopathy.  Neurological: She is alert and oriented to person, place, and time.  Skin: Skin is warm and dry. She is not diaphoretic.  Psychiatric: She has a normal mood and affect. Her behavior is normal.    BP 112/64 (BP Location: Right Arm, Patient Position: Sitting, Cuff Size: Normal)   Pulse 70   Temp (!) 97.4 F (36.3 C) (Temporal)   Ht 4' 9.5" (1.461 m)   Wt 158 lb (71.7 kg)   SpO2 94%   BMI 33.60 kg/m  Wt Readings from Last 3 Encounters:  07/04/19 158 lb (71.7 kg)  03/06/18 152 lb 6 oz (69.1 kg)  05/17/13 140 lb (63.5 kg)     Health Maintenance Due  Topic Date Due  . COVID-19 Vaccine (1) Never done    There are no preventive care reminders to display for this patient.  Lab Results  Component Value Date   TSH 1.75 03/07/2018   Lab Results  Component Value Date   WBC 4.2 07/04/2019   HGB 12.9 07/04/2019   HCT 38.9 07/04/2019   MCV 94.4 07/04/2019   PLT 219.0 07/04/2019   Lab Results  Component Value Date   NA 140 07/04/2019   K 4.2 07/04/2019   CO2 28 07/04/2019   GLUCOSE 94 07/04/2019   BUN 12 07/04/2019   CREATININE 0.68 07/04/2019   BILITOT 0.5 07/04/2019   ALKPHOS 62 07/04/2019   AST 16 07/04/2019   ALT 12 07/04/2019   PROT 6.9 07/04/2019   ALBUMIN 4.2 07/04/2019   CALCIUM 9.1 07/04/2019   GFR 107.35 07/04/2019   Lab Results  Component Value Date   CHOL 230 (H) 07/04/2019   Lab Results  Component Value Date   HDL 49.30 07/04/2019   Lab Results  Component Value Date   LDLCALC 168 (H) 07/04/2019   Lab  Results  Component Value Date   TRIG 63.0 07/04/2019   Lab Results  Component Value Date   CHOLHDL 5 07/04/2019   Lab Results  Component Value Date   HGBA1C 6.0 07/04/2019      Assessment & Plan:   Problem List Items Addressed This Visit      Other   Health care maintenance - Primary   Relevant Orders   Hemoglobin A1c (Completed)   CBC (Completed)   Comprehensive metabolic panel (Completed)   Urinalysis, Routine w reflex microscopic (Completed)   MM Digital Screening   Ambulatory referral to Ophthalmology  Ambulatory referral to Gastroenterology    Other Visit Diagnoses    Elevated LDL cholesterol level       Relevant Medications   atorvastatin (LIPITOR) 20 MG tablet   Other Relevant Orders   LDL cholesterol, direct (Completed)   Lipid panel (Completed)      Meds ordered this encounter  Medications  . atorvastatin (LIPITOR) 20 MG tablet    Sig: Take 1 tablet (20 mg total) by mouth daily.    Dispense:  90 tablet    Refill:  3    Follow-up: Return in about 6 months (around 01/03/2020).   Given information on health maintenance and disease prevention.  Given information on preventing high cholesterol.  Advised female check through her GYN provider.  Advised exercise by walking for 30 minutes at least 5 days weekly.  Discussed treatment of her LDL cholesterol if it remains elevated. Libby Maw, MD

## 2019-07-04 NOTE — Patient Instructions (Signed)
Preventing High Cholesterol Cholesterol is a white, waxy substance similar to fat that the human body needs to help build cells. The liver makes all the cholesterol that a person's body needs. Having high cholesterol (hypercholesterolemia) increases a person's risk for heart disease and stroke. Extra (excess) cholesterol comes from the food the person eats. High cholesterol can often be prevented with diet and lifestyle changes. If you already have high cholesterol, you can control it with diet and lifestyle changes and with medicine. How can high cholesterol affect me? If you have high cholesterol, deposits (plaques) may build up on the walls of your arteries. The arteries are the blood vessels that carry blood away from your heart. Plaques make the arteries narrower and stiffer. This can limit or block blood flow and cause blood clots to form. Blood clots:  Are tiny balls of cells that form in your blood.  Can move to the heart or brain, causing a heart attack or stroke. Plaques in arteries greatly increase your risk for heart attack and stroke.Making diet and lifestyle changes can reduce your risk for these conditions that may threaten your life. What can increase my risk? This condition is more likely to develop in people who:  Eat foods that are high in saturated fat or cholesterol. Saturated fat is mostly found in: ? Foods that contain animal fat, such as red meat and some dairy products. ? Certain fatty foods made from plants, such as tropical oils.  Are overweight.  Are not getting enough exercise.  Have a family history of high cholesterol. What actions can I take to prevent this? Nutrition   Eat less saturated fat.  Avoid trans fats (partially hydrogenated oils). These are often found in margarine and in some baked goods, fried foods, and snacks bought in packages.  Avoid precooked or cured meat, such as sausages or meat loaves.  Avoid foods and drinks that have added  sugars.  Eat more fruits, vegetables, and whole grains.  Choose healthy sources of protein, such as fish, poultry, lean cuts of red meat, beans, peas, lentils, and nuts.  Choose healthy sources of fat, such as: ? Nuts. ? Vegetable oils, especially olive oil. ? Fish that have healthy fats (omega-3 fatty acids), such as mackerel or salmon. The items listed above may not be a complete list of recommended foods and beverages. Contact a dietitian for more information. Lifestyle  Lose weight if you are overweight. Losing 5-10 lb (2.3-4.5 kg) can help prevent or control high cholesterol. It can also lower your risk for diabetes and high blood pressure. Ask your health care provider to help you with a diet and exercise plan to lose weight safely.  Do not use any products that contain nicotine or tobacco, such as cigarettes, e-cigarettes, and chewing tobacco. If you need help quitting, ask your health care provider.  Limit your alcohol intake. ? Do not drink alcohol if:  Your health care provider tells you not to drink.  You are pregnant, may be pregnant, or are planning to become pregnant. ? If you drink alcohol:  Limit how much you use to:  0-1 drink a day for women.  0-2 drinks a day for men.  Be aware of how much alcohol is in your drink. In the [place] one drink equals one 12 oz bottle of beer (355 mL), one 5 oz glass of wine (148 mL), or one 1 oz glass of hard liquor (44 mL). Activity   Get enough exercise. Each week, do at  least 150 minutes of exercise that takes a medium level of effort (moderate-intensity exercise). ? This is exercise that:  Makes your heart beat faster and makes you breathe harder than usual.  Allows you to still be able to talk. ? You could exercise in short sessions several times a day or longer sessions a few times a week. For example, on 5 days each week, you could walk fast or ride your bike 3 times a day for 10 minutes each time.  Do exercises as told  by your health care provider. Medicines  In addition to diet and lifestyle changes, your health care provider may recommend medicines to help lower cholesterol. This may be a medicine to lower the amount of cholesterol your liver makes. You may need medicine if: ? Diet and lifestyle changes do not lower your cholesterol enough. ? You have high cholesterol and other risk factors for heart disease or stroke.  Take over-the-counter and prescription medicines only as told by your health care provider. General information  Manage your risk factors for high cholesterol. Talk with your health care provider about all your risk factors and how to lower your risk.  Manage other conditions that you have, such as diabetes or high blood pressure (hypertension).  Have blood tests to check your cholesterol levels at regular points in time as told by your health care provider.  Keep all follow-up visits as told by your health care provider. This is important. Where to find more information  American Heart Association: www.heart.org  National Heart, Lung, and Blood Institute: https://wilson-eaton.com/ Summary  High cholesterol increases your risk for heart disease and stroke. By keeping your cholesterol level low, you can reduce your risk for these conditions.  High cholesterol can often be prevented with diet and lifestyle changes.  Work with your health care provider to manage your risk factors, and have your blood tested regularly. This information is not intended to replace advice given to you by your health care provider. Make sure you discuss any questions you have with your health care provider. Document Revised: 05/05/2018 Document Reviewed: 09/20/2015 Elsevier Patient Education  [place] Maintenance for Postmenopausal Women Menopause is a normal process in which your ability to get pregnant comes to an end. This process happens slowly over many months or years, usually between  the ages of 67 and 56. Menopause is complete when you have missed your menstrual periods for 12 months. It is important to talk with your health care provider about some of the most common conditions that affect women after menopause (postmenopausal women). These include heart disease, cancer, and bone loss (osteoporosis). Adopting a healthy lifestyle and getting preventive care can help to promote your health and wellness. The actions you take can also lower your chances of developing some of these common conditions. What should I know about menopause? During menopause, you may get a number of symptoms, such as:  Hot flashes. These can be moderate or severe.  Night sweats.  Decrease in sex drive.  Mood swings.  Headaches.  Tiredness.  Irritability.  Memory problems.  Insomnia. Choosing to treat or not to treat these symptoms is a decision that you make with your health care provider. Do I need hormone replacement therapy?  Hormone replacement therapy is effective in treating symptoms that are caused by menopause, such as hot flashes and night sweats.  Hormone replacement carries certain risks, especially as you become older. If you are thinking about using estrogen or estrogen  with progestin, discuss the benefits and risks with your health care provider. What is my risk for heart disease and stroke? The risk of heart disease, heart attack, and stroke increases as you age. One of the causes may be a change in the body's hormones during menopause. This can affect how your body uses dietary fats, triglycerides, and cholesterol. Heart attack and stroke are medical emergencies. There are many things that you can do to help prevent heart disease and stroke. Watch your blood pressure  High blood pressure causes heart disease and increases the risk of stroke. This is more likely to develop in people who have high blood pressure readings, are of African descent, or are overweight.  Have  your blood pressure checked: ? Every 3-5 years if you are 75-41 years of age. ? Every year if you are 16 years old or older. Eat a healthy diet   Eat a diet that includes plenty of vegetables, fruits, low-fat dairy products, and lean protein.  Do not eat a lot of foods that are high in solid fats, added sugars, or sodium. Get regular exercise Get regular exercise. This is one of the most important things you can do for your health. Most adults should:  Try to exercise for at least 150 minutes each week. The exercise should increase your heart rate and make you sweat (moderate-intensity exercise).  Try to do strengthening exercises at least twice each week. Do these in addition to the moderate-intensity exercise.  Spend less time sitting. Even light physical activity can be beneficial. Other tips  Work with your health care provider to achieve or maintain a healthy weight.  Do not use any products that contain nicotine or tobacco, such as cigarettes, e-cigarettes, and chewing tobacco. If you need help quitting, ask your health care provider.  Know your numbers. Ask your health care provider to check your cholesterol and your blood sugar (glucose). Continue to have your blood tested as directed by your health care provider. Do I need screening for cancer? Depending on your health history and family history, you may need to have cancer screening at different stages of your life. This may include screening for:  Breast cancer.  Cervical cancer.  Lung cancer.  Colorectal cancer. What is my risk for osteoporosis? After menopause, you may be at increased risk for osteoporosis. Osteoporosis is a condition in which bone destruction happens more quickly than new bone creation. To help prevent osteoporosis or the bone fractures that can happen because of osteoporosis, you may take the following actions:  If you are 18-54 years old, get at least 1,000 mg of calcium and at least 600 mg of  vitamin D per day.  If you are older than age 83 but younger than age 51, get at least 1,200 mg of calcium and at least 600 mg of vitamin D per day.  If you are older than age 3, get at least 1,200 mg of calcium and at least 800 mg of vitamin D per day. Smoking and drinking excessive alcohol increase the risk of osteoporosis. Eat foods that are rich in calcium and vitamin D, and do weight-bearing exercises several times each week as directed by your health care provider. How does menopause affect my mental health? Depression may occur at any age, but it is more common as you become older. Common symptoms of depression include:  Low or sad mood.  Changes in sleep patterns.  Changes in appetite or eating patterns.  Feeling an overall lack  of motivation or enjoyment of activities that you previously enjoyed.  Frequent crying spells. Talk with your health care provider if you think that you are experiencing depression. General instructions See your health care provider for regular wellness exams and vaccines. This may include:  Scheduling regular health, dental, and eye exams.  Getting and maintaining your vaccines. These include: ? Influenza vaccine. Get this vaccine each year before the flu season begins. ? Pneumonia vaccine. ? Shingles vaccine. ? Tetanus, diphtheria, and pertussis (Tdap) booster vaccine. Your health care provider may also recommend other immunizations. Tell your health care provider if you have ever been abused or do not feel safe at home. Summary  Menopause is a normal process in which your ability to get pregnant comes to an end.  This condition causes hot flashes, night sweats, decreased interest in sex, mood swings, headaches, or lack of sleep.  Treatment for this condition may include hormone replacement therapy.  Take actions to keep yourself healthy, including exercising regularly, eating a healthy diet, watching your weight, and checking your blood  pressure and blood sugar levels.  Get screened for cancer and depression. Make sure that you are up to date with all your vaccines. This information is not intended to replace advice given to you by your health care provider. Make sure you discuss any questions you have with your health care provider. Document Revised: 01/04/2018 Document Reviewed: 01/04/2018 Elsevier Patient Education  2020 [employer]  Preventive Care 3-59 Years Old, Female Preventive care refers to visits with your health care provider and lifestyle choices that can promote health and wellness. This includes:  A yearly physical exam. This may also be called an annual well check.  Regular dental visits and eye exams.  Immunizations.  Screening for certain conditions.  Healthy lifestyle choices, such as eating a healthy diet, getting regular exercise, not using drugs or products that contain nicotine and tobacco, and limiting alcohol use. What can I expect for my preventive care visit? Physical exam Your health care provider will check your:  Height and weight. This may be used to calculate body mass index (BMI), which tells if you are at a healthy weight.  Heart rate and blood pressure.  Skin for abnormal spots. Counseling Your health care provider may ask you questions about your:  Alcohol, tobacco, and drug use.  Emotional well-being.  Home and relationship well-being.  Sexual activity.  Eating habits.  Work and work Statistician.  Method of birth control.  Menstrual cycle.  Pregnancy history. What immunizations do I need?  Influenza (flu) vaccine  This is recommended every year. Tetanus, diphtheria, and pertussis (Tdap) vaccine  You may need a Td booster every 10 years. Varicella (chickenpox) vaccine  You may need this if you have not been vaccinated. Zoster (shingles) vaccine  You may need this after age 9. Measles, mumps, and rubella (MMR) vaccine  You may need at least one  dose of MMR if you were born in 1957 or later. You may also need a second dose. Pneumococcal conjugate (PCV13) vaccine  You may need this if you have certain conditions and were not previously vaccinated. Pneumococcal polysaccharide (PPSV23) vaccine  You may need one or two doses if you smoke cigarettes or if you have certain conditions. Meningococcal conjugate (MenACWY) vaccine  You may need this if you have certain conditions. Hepatitis A vaccine  You may need this if you have certain conditions or if you travel or work in places where you may be exposed  to hepatitis A. Hepatitis B vaccine  You may need this if you have certain conditions or if you travel or work in places where you may be exposed to hepatitis B. Haemophilus influenzae type b (Hib) vaccine  You may need this if you have certain conditions. Human papillomavirus (HPV) vaccine  If recommended by your health care provider, you may need three doses over 6 months. You may receive vaccines as individual doses or as more than one vaccine together in one shot (combination vaccines). Talk with your health care provider about the risks and benefits of combination vaccines. What tests do I need? Blood tests  Lipid and cholesterol levels. These may be checked every 5 years, or more frequently if you are over 64 years old.  Hepatitis C test.  Hepatitis B test. Screening  Lung cancer screening. You may have this screening every year starting at age 7 if you have a 30-pack-year history of smoking and currently smoke or have quit within the past 15 years.  Colorectal cancer screening. All adults should have this screening starting at age 36 and continuing until age 41. Your health care provider may recommend screening at age 62 if you are at increased risk. You will have tests every 1-10 years, depending on your results and the type of screening test.  Diabetes screening. This is done by checking your blood sugar (glucose)  after you have not eaten for a while (fasting). You may have this done every 1-3 years.  Mammogram. This may be done every 1-2 years. Talk with your health care provider about when you should start having regular mammograms. This may depend on whether you have a family history of breast cancer.  BRCA-related cancer screening. This may be done if you have a family history of breast, ovarian, tubal, or peritoneal cancers.  Pelvic exam and Pap test. This may be done every 3 years starting at age 42. Starting at age 42, this may be done every 5 years if you have a Pap test in combination with an HPV test. Other tests  Sexually transmitted disease (STD) testing.  Bone density scan. This is done to screen for osteoporosis. You may have this scan if you are at high risk for osteoporosis. Follow these instructions at home: Eating and drinking  Eat a diet that includes fresh fruits and vegetables, whole grains, lean protein, and low-fat dairy.  Take vitamin and mineral supplements as recommended by your health care provider.  Do not drink alcohol if: ? Your health care provider tells you not to drink. ? You are pregnant, may be pregnant, or are planning to become pregnant.  If you drink alcohol: ? Limit how much you have to 0-1 drink a day. ? Be aware of how much alcohol is in your drink. In the [place] one drink equals one 12 oz bottle of beer (355 mL), one 5 oz glass of wine (148 mL), or one 1 oz glass of hard liquor (44 mL). Lifestyle  Take daily care of your teeth and gums.  Stay active. Exercise for at least 30 minutes on 5 or more days each week.  Do not use any products that contain nicotine or tobacco, such as cigarettes, e-cigarettes, and chewing tobacco. If you need help quitting, ask your health care provider.  If you are sexually active, practice safe sex. Use a condom or other form of birth control (contraception) in order to prevent pregnancy and STIs (sexually transmitted  infections).  If told by your health  care provider, take low-dose aspirin daily starting at age 25. What's next?  Visit your health care provider once a year for a well check visit.  Ask your health care provider how often you should have your eyes and teeth checked.  Stay up to date on all vaccines. This information is not intended to replace advice given to you by your health care provider. Make sure you discuss any questions you have with your health care provider. Document Revised: 09/22/2017 Document Reviewed: 09/22/2017 Elsevier Patient Education  2020 [employer].

## 2019-07-05 MED ORDER — ATORVASTATIN CALCIUM 20 MG PO TABS: 20.0000 mg | ORAL_TABLET | Freq: Every day | ORAL | 3 refills | Status: DC

## 2019-07-05 NOTE — Addendum Note (Signed)
Addended by: Jon Billings on: 07/05/2019 07:26 AM   Modules accepted: Orders

## 2019-07-16 ENCOUNTER — Encounter: Payer: Self-pay | Admitting: Family Medicine

## 2019-07-17 ENCOUNTER — Encounter: Payer: Self-pay | Admitting: Family Medicine

## 2019-07-17 NOTE — Telephone Encounter (Signed)
Both Etherine and her husband are of sound mind and physically fit to adopt small children. They will make loving and caring parents.

## 2019-07-20 ENCOUNTER — Encounter: Payer: Self-pay | Admitting: Family Medicine

## 2019-07-20 NOTE — Telephone Encounter (Signed)
Patient is calling to check on status of these documents. Please call and let her know how to proceed. Her number is [phone].

## 2019-07-23 ENCOUNTER — Encounter: Payer: Self-pay | Admitting: Family Medicine

## 2019-11-20 ENCOUNTER — Ambulatory Visit: Payer: 59 | Admitting: Family Medicine

## 2020-07-04 ENCOUNTER — Other Ambulatory Visit (HOSPITAL_COMMUNITY): Payer: Self-pay

## 2020-07-21 ENCOUNTER — Encounter: Payer: Self-pay | Admitting: Family Medicine

## 2020-07-21 ENCOUNTER — Other Ambulatory Visit: Payer: Self-pay

## 2020-07-21 ENCOUNTER — Ambulatory Visit (INDEPENDENT_AMBULATORY_CARE_PROVIDER_SITE_OTHER): Payer: 59 | Admitting: Family Medicine

## 2020-07-21 VITALS — BP 102/64 | HR 62 | Temp 97.9°F | Ht <= 58 in | Wt 158.4 lb

## 2020-07-21 DIAGNOSIS — Z0000 Encounter for general adult medical examination without abnormal findings: Secondary | ICD-10-CM | POA: Diagnosis not present

## 2020-07-21 DIAGNOSIS — E7800 Pure hypercholesterolemia, unspecified: Secondary | ICD-10-CM | POA: Diagnosis not present

## 2020-07-21 LAB — LDL CHOLESTEROL, DIRECT: Direct LDL: 171 mg/dL

## 2020-07-21 LAB — COMPREHENSIVE METABOLIC PANEL
ALT: 12 U/L (ref 0–35)
AST: 16 U/L (ref 0–37)
Albumin: 4.3 g/dL (ref 3.5–5.2)
Alkaline Phosphatase: 71 U/L (ref 39–117)
BUN: 10 mg/dL (ref 6–23)
CO2: 25 mEq/L (ref 19–32)
Calcium: 9.5 mg/dL (ref 8.4–10.5)
Chloride: 105 mEq/L (ref 96–112)
Creatinine, Ser: 0.59 mg/dL (ref 0.40–1.20)
GFR: 98.57 mL/min (ref >60.00)
Glucose, Bld: 93 mg/dL (ref 70–99)
Potassium: 3.8 mEq/L (ref 3.5–5.1)
Sodium: 139 mEq/L (ref 135–145)
Total Bilirubin: 0.5 mg/dL (ref 0.2–1.2)
Total Protein: 7 g/dL (ref 6.0–8.3)

## 2020-07-21 LAB — URINALYSIS, ROUTINE W REFLEX MICROSCOPIC
Bilirubin Urine: NEGATIVE
Hgb urine dipstick: NEGATIVE
Ketones, ur: NEGATIVE
Nitrite: NEGATIVE
Specific Gravity, Urine: 1.015 (ref 1.000–1.030)
Total Protein, Urine: NEGATIVE
Urine Glucose: NEGATIVE
Urobilinogen, UA: 0.2 (ref 0.0–1.0)
pH: 6 (ref 5.0–8.0)

## 2020-07-21 LAB — LIPID PANEL
Cholesterol: 238 mg/dL — ABNORMAL HIGH (ref 0–200)
HDL: 53.4 mg/dL (ref >39.00)
LDL Cholesterol: 173 mg/dL — ABNORMAL HIGH (ref 0–99)
NonHDL: 185.06
Total CHOL/HDL Ratio: 4
Triglycerides: 62 mg/dL (ref 0.0–149.0)
VLDL: 12.4 mg/dL (ref 0.0–40.0)

## 2020-07-21 LAB — CBC
HCT: 37.7 % (ref 36.0–46.0)
Hemoglobin: 12.7 g/dL (ref 12.0–15.0)
MCHC: 33.6 g/dL (ref 30.0–36.0)
MCV: 92.9 fl (ref 78.0–100.0)
Platelets: 207 10*3/uL (ref 150.0–400.0)
RBC: 4.06 Mil/uL (ref 3.87–5.11)
RDW: 12.8 % (ref 11.5–15.5)
WBC: 3.8 10*3/uL — ABNORMAL LOW (ref 4.0–10.5)

## 2020-07-21 NOTE — Progress Notes (Addendum)
New Patient Office Visit  Subjective:  Patient ID: Stacey Weiss, female    DOB: 10-Aug-1960  Age: 60 y.o. MRN: [id number]  CC:  Chief Complaint  Patient presents with   Annual Exam    HPI Stacey Weiss presents for for her yearly physical.  She is fasting today.  She is accompanied by her husband.  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  She does not smoke drink alcohol or use illicit drugs.  She has never had a colonoscopy.  Last mammogram was in 2015.  No recent Pap smear.  She does not see the dentist regularly.  She never started the Lipitor that was prescribed at the last visit.  Past Medical History:  Diagnosis Date   Blepharospasm 05/17/2013   Fibroid    Leiomyoma of uterus, unspecified     Past Surgical History:  Procedure Laterality Date   ABDOMINAL HYSTERECTOMY N/A 04/17/2013   Procedure: HYSTERECTOMY ABDOMINAL BILATERAL SALPINGECTOMY Through Vertical Incision;  Surgeon: Princess Bruins, MD;  Location: [facility];  Service: Gynecology;  Laterality: N/A;   BILATERAL SALPINGECTOMY      Family History  Problem Relation Age of Onset   Allergies Sister    Rheum arthritis Mother    Rheum arthritis Sister     Social History   Socioeconomic History   Marital status: Married    Spouse name: Not on file   Number of children: 0   Years of education: college   Highest education level: Not on file  Occupational History   Occupation: CNA    Employer: [employer]    Comment: Part-Time  Tobacco Use   Smoking status: Never   Smokeless tobacco: Never  Vaping Use   Vaping Use: Never used  Substance and Sexual Activity   Alcohol use: No   Drug use: No   Sexual activity: Yes    Birth control/protection: None  Other Topics Concern   Not on file  Social History Narrative   Not on file   Social Determinants of Health   Financial Resource Strain: Not on file  Food Insecurity: Not on file  Transportation Needs: Not on file  Physical  Activity: Not on file  Stress: Not on file  Social Connections: Not on file  Intimate Partner Violence: Not on file    ROS Review of Systems  Constitutional: Negative.   HENT: Negative.    Eyes:  Negative for photophobia and visual disturbance.  Respiratory: Negative.    Cardiovascular: Negative.   Gastrointestinal: Negative.   Endocrine: Negative for polyphagia and polyuria.  Genitourinary: Negative.   Neurological:  Negative for speech difficulty and weakness.  Psychiatric/Behavioral: Negative.    Depression screen [facility] 2/9 07/21/2020 07/04/2019  Decreased Interest 0 0  Down, Depressed, Hopeless 0 0  PHQ - 2 Score 0 0  Altered sleeping 0 1  Tired, decreased energy 0 0  Change in appetite 0 0  Feeling bad or failure about yourself  0 0  Trouble concentrating 0 0  Moving slowly or fidgety/restless 0 0  Suicidal thoughts 0 0  PHQ-9 Score 0 1  Difficult doing work/chores Not difficult at all Not difficult at all     Objective:   Today's Vitals: BP 102/64   Pulse 62   Temp 97.9 F (36.6 C)   Ht 4\' 9"  (1.448 m)   Wt 158 lb 6.4 oz (71.8 kg)   SpO2 98%   BMI 34.28 kg/m   Physical Exam Vitals and  nursing note reviewed.  Constitutional:      General: She is not in acute distress.    Appearance: Normal appearance. She is not ill-appearing, toxic-appearing or diaphoretic.  HENT:     Head: Normocephalic and atraumatic.     Right Ear: Tympanic membrane, ear canal and external ear normal.     Left Ear: Tympanic membrane, ear canal and external ear normal.     Mouth/Throat:     Mouth: Mucous membranes are moist.     Pharynx: Oropharynx is clear. No oropharyngeal exudate or posterior oropharyngeal erythema.  Eyes:     General: No scleral icterus.       Right eye: No discharge.        Left eye: No discharge.     Extraocular Movements: Extraocular movements intact.     Conjunctiva/sclera: Conjunctivae normal.     Pupils: Pupils are equal, round, and reactive to light.   Neck:     Vascular: No carotid bruit.  Cardiovascular:     Rate and Rhythm: Normal rate and regular rhythm.  Pulmonary:     Effort: Pulmonary effort is normal.     Breath sounds: Normal breath sounds.  Abdominal:     General: Bowel sounds are normal.  Musculoskeletal:     Cervical back: No rigidity or tenderness.     Right lower leg: No edema.     Left lower leg: No edema.  Lymphadenopathy:     Cervical: No cervical adenopathy.  Skin:    General: Skin is warm and dry.  Neurological:     Mental Status: She is alert and oriented to person, place, and time.  Psychiatric:        Mood and Affect: Mood normal.        Behavior: Behavior normal.    Assessment & Plan:   Problem List Items Addressed This Visit       Other   Health care maintenance - Primary   Relevant Orders   CBC (Completed)   Comprehensive metabolic panel (Completed)   Urinalysis, Routine w reflex microscopic (Completed)   MM Digital Screening   Ambulatory referral to Gastroenterology   Ambulatory referral to Gynecology   Other Visit Diagnoses     Elevated LDL cholesterol level       Relevant Medications   atorvastatin (LIPITOR) 20 MG tablet   Other Relevant Orders   Comprehensive metabolic panel (Completed)   LDL cholesterol, direct (Completed)   Lipid panel (Completed)       Outpatient Encounter Medications as of 07/21/2020  Medication Sig   atorvastatin (LIPITOR) 20 MG tablet Take 1 tablet (20 mg total) by mouth daily.   [DISCONTINUED] atorvastatin (LIPITOR) 20 MG tablet Take 1 tablet (20 mg total) by mouth daily. (Patient not taking: Reported on 07/21/2020)   No facility-administered encounter medications on file as of 07/21/2020.    Follow-up: Return in about 6 months (around 01/20/2021).  Given information on health maintenance disease prevention as well as managing high cholesterol.  She has been working to lower cholesterol.  She will take the medicine as prescribed if it is not lower.   Advised her to see a dentist regularly for teeth cleanings at least twice daily.  Mammogram, consultation with GYN and gastroenterology.  Follow-up in 6 months.  Libby Maw, MD

## 2020-07-22 ENCOUNTER — Other Ambulatory Visit (HOSPITAL_COMMUNITY): Payer: Self-pay

## 2020-07-22 MED ORDER — ATORVASTATIN CALCIUM 20 MG PO TABS
20.0000 mg | ORAL_TABLET | Freq: Every day | ORAL | 3 refills | Status: DC
  Filled 2020-07-22: qty 90, 90d supply, fill #0

## 2020-07-22 NOTE — Addendum Note (Signed)
Addended by: Jon Billings on: 07/22/2020 07:15 AM   Modules accepted: Orders

## 2020-07-23 ENCOUNTER — Encounter: Payer: Self-pay | Admitting: Family Medicine

## 2020-10-02 ENCOUNTER — Encounter: Payer: Self-pay | Admitting: Nurse Practitioner

## 2020-10-28 ENCOUNTER — Encounter: Payer: Self-pay | Admitting: Family Medicine

## 2020-10-31 ENCOUNTER — Encounter: Payer: Self-pay | Admitting: Family Medicine

## 2021-02-13 ENCOUNTER — Other Ambulatory Visit (HOSPITAL_COMMUNITY): Payer: Self-pay

## 2021-02-13 MED ORDER — TERBINAFINE HCL 250 MG PO TABS
250.0000 mg | ORAL_TABLET | Freq: Every day | ORAL | 1 refills | Status: DC
  Filled 2021-02-13: qty 90, 90d supply, fill #0

## 2021-02-13 MED ORDER — TRIAMCINOLONE ACETONIDE 0.1 % EX OINT
TOPICAL_OINTMENT | Freq: Two times a day (BID) | CUTANEOUS | 1 refills | Status: AC
  Filled 2021-02-13: qty 454, 90d supply, fill #0

## 2021-02-17 ENCOUNTER — Other Ambulatory Visit (HOSPITAL_COMMUNITY): Payer: Self-pay

## 2021-02-24 ENCOUNTER — Other Ambulatory Visit: Payer: Self-pay

## 2021-02-26 ENCOUNTER — Ambulatory Visit: Payer: 59 | Admitting: Family Medicine

## 2021-02-26 ENCOUNTER — Ambulatory Visit (INDEPENDENT_AMBULATORY_CARE_PROVIDER_SITE_OTHER): Payer: 59

## 2021-02-26 ENCOUNTER — Encounter: Payer: Self-pay | Admitting: Family Medicine

## 2021-02-26 ENCOUNTER — Other Ambulatory Visit (HOSPITAL_COMMUNITY): Payer: Self-pay

## 2021-02-26 ENCOUNTER — Other Ambulatory Visit: Payer: Self-pay

## 2021-02-26 VITALS — BP 115/70 | HR 61 | Temp 98.1°F | Ht <= 58 in | Wt 157.0 lb

## 2021-02-26 DIAGNOSIS — M79602 Pain in left arm: Secondary | ICD-10-CM | POA: Diagnosis not present

## 2021-02-26 DIAGNOSIS — E7800 Pure hypercholesterolemia, unspecified: Secondary | ICD-10-CM

## 2021-02-26 DIAGNOSIS — M7552 Bursitis of left shoulder: Secondary | ICD-10-CM

## 2021-02-26 DIAGNOSIS — M25712 Osteophyte, left shoulder: Secondary | ICD-10-CM | POA: Diagnosis not present

## 2021-02-26 DIAGNOSIS — M19012 Primary osteoarthritis, left shoulder: Secondary | ICD-10-CM | POA: Diagnosis not present

## 2021-02-26 LAB — COMPREHENSIVE METABOLIC PANEL
ALT: 12 U/L (ref 0–35)
AST: 18 U/L (ref 0–37)
Albumin: 4.5 g/dL (ref 3.5–5.2)
Alkaline Phosphatase: 65 U/L (ref 39–117)
BUN: 12 mg/dL (ref 6–23)
CO2: 28 mEq/L (ref 19–32)
Calcium: 9.9 mg/dL (ref 8.4–10.5)
Chloride: 104 mEq/L (ref 96–112)
Creatinine, Ser: 0.69 mg/dL (ref 0.40–1.20)
GFR: 94.52 mL/min (ref >60.00)
Glucose, Bld: 79 mg/dL (ref 70–99)
Potassium: 3.7 mEq/L (ref 3.5–5.1)
Sodium: 140 mEq/L (ref 135–145)
Total Bilirubin: 0.6 mg/dL (ref 0.2–1.2)
Total Protein: 7.8 g/dL (ref 6.0–8.3)

## 2021-02-26 LAB — LIPID PANEL
Cholesterol: 251 mg/dL — ABNORMAL HIGH (ref 0–200)
HDL: 61.6 mg/dL (ref >39.00)
LDL Cholesterol: 174 mg/dL — ABNORMAL HIGH (ref 0–99)
NonHDL: 189.72
Total CHOL/HDL Ratio: 4
Triglycerides: 78 mg/dL (ref 0.0–149.0)
VLDL: 15.6 mg/dL (ref 0.0–40.0)

## 2021-02-26 LAB — D-DIMER, QUANTITATIVE: D-Dimer, Quant: 0.3 mcg/mL FEU (ref <0.50)

## 2021-02-26 MED ORDER — MELOXICAM 7.5 MG PO TABS
7.5000 mg | ORAL_TABLET | Freq: Every day | ORAL | 0 refills | Status: DC
  Filled 2021-02-26: qty 30, 30d supply, fill #0

## 2021-02-26 NOTE — Progress Notes (Signed)
Established Patient Office Visit  Subjective:  Patient ID: Stacey Weiss, female    DOB: 07-Mar-1960  Age: 61 y.o. MRN: [id number]  CC:  Chief Complaint  Patient presents with   Arm Pain    Pain in left arm x 2 months come and go becoming worse. Sometimes will last for 1 week.     HPI Stacey Weiss presents for evaluation of a 39-month history of left arm pain.  There was no injury.  She is right-hand dominant.  Pain seems to be in the upper arm.  Denies neck pain or paresthesias in her arms associated with this particular pain.  She has some stiffness and pain in her neck that is currently quiescent.  Is been taking her Lipitor daily without issue.  She is fasting  Past Medical History:  Diagnosis Date   Blepharospasm 05/17/2013   Fibroid    Leiomyoma of uterus, unspecified     Past Surgical History:  Procedure Laterality Date   ABDOMINAL HYSTERECTOMY N/A 04/17/2013   Procedure: HYSTERECTOMY ABDOMINAL BILATERAL SALPINGECTOMY Through Vertical Incision;  Surgeon: Princess Bruins, MD;  Location: [facility];  Service: Gynecology;  Laterality: N/A;   BILATERAL SALPINGECTOMY      Family History  Problem Relation Age of Onset   Allergies Sister    Rheum arthritis Mother    Rheum arthritis Sister     Social History   Socioeconomic History   Marital status: Married    Spouse name: Not on file   Number of children: 0   Years of education: college   Highest education level: Not on file  Occupational History   Occupation: CNA    Employer: [employer]    Comment: Part-Time  Tobacco Use   Smoking status: Never   Smokeless tobacco: Never  Vaping Use   Vaping Use: Never used  Substance and Sexual Activity   Alcohol use: No   Drug use: No   Sexual activity: Yes    Birth control/protection: None  Other Topics Concern   Not on file  Social History Narrative   Not on file   Social Determinants of Health   Financial Resource Strain: Not on file  Food  Insecurity: Not on file  Transportation Needs: Not on file  Physical Activity: Not on file  Stress: Not on file  Social Connections: Not on file  Intimate Partner Violence: Not on file    Outpatient Medications Prior to Visit  Medication Sig Dispense Refill   atorvastatin (LIPITOR) 20 MG tablet Take 1 tablet (20 mg total) by mouth daily. 90 tablet 3   triamcinolone ointment (KENALOG) 0.1 % Apply topically to affected area 2 (two) times daily. 454 g 1   No facility-administered medications prior to visit.    No Known Allergies  ROS Review of Systems  Constitutional:  Negative for diaphoresis, fatigue, fever and unexpected weight change.  HENT: Negative.    Eyes:  Negative for photophobia and visual disturbance.  Respiratory: Negative.    Cardiovascular: Negative.   Gastrointestinal: Negative.   Genitourinary: Negative.   Musculoskeletal:  Positive for arthralgias. Negative for neck pain and neck stiffness.  Neurological:  Negative for speech difficulty, weakness and numbness.  Psychiatric/Behavioral: Negative.       Objective:    Physical Exam Vitals and nursing note reviewed.  Constitutional:      Appearance: Normal appearance.  HENT:     Head: Normocephalic and atraumatic.     Right Ear: External ear normal.  Left Ear: External ear normal.     Mouth/Throat:     Mouth: Mucous membranes are moist.     Pharynx: Oropharynx is clear. No oropharyngeal exudate or posterior oropharyngeal erythema.  Eyes:     General: No scleral icterus.       Right eye: No discharge.        Left eye: No discharge.     Extraocular Movements: Extraocular movements intact.     Conjunctiva/sclera: Conjunctivae normal.     Pupils: Pupils are equal, round, and reactive to light.  Cardiovascular:     Rate and Rhythm: Normal rate and regular rhythm.  Pulmonary:     Effort: Pulmonary effort is normal.     Breath sounds: Normal breath sounds.  Musculoskeletal:     Left shoulder: No  tenderness or bony tenderness. Normal range of motion. Normal pulse.       Arms:     Cervical back: No rigidity, tenderness or bony tenderness. No pain with movement. Normal range of motion.       Back:  Lymphadenopathy:     Cervical: No cervical adenopathy.  Skin:    General: Skin is warm and dry.  Neurological:     Mental Status: She is alert and oriented to person, place, and time.  Psychiatric:        Mood and Affect: Mood normal.        Behavior: Behavior normal.    BP 115/70 (BP Location: Right Arm, Patient Position: Sitting, Cuff Size: Normal)    Pulse 61    Temp 98.1 F (36.7 C) (Oral)    Ht 4\' 9"  (1.448 m)    Wt 157 lb (71.2 kg)    SpO2 98%    BMI 33.97 kg/m  Wt Readings from Last 3 Encounters:  02/26/21 157 lb (71.2 kg)  07/21/20 158 lb 6.4 oz (71.8 kg)  07/04/19 158 lb (71.7 kg)     Health Maintenance Due  Topic Date Due   Zoster Vaccines- Shingrix (1 of 2) Never done    There are no preventive care reminders to display for this patient.  Lab Results  Component Value Date   TSH 1.75 03/07/2018   Lab Results  Component Value Date   WBC 3.8 (L) 07/21/2020   HGB 12.7 07/21/2020   HCT 37.7 07/21/2020   MCV 92.9 07/21/2020   PLT 207.0 07/21/2020   Lab Results  Component Value Date   NA 139 07/21/2020   K 3.8 07/21/2020   CO2 25 07/21/2020   GLUCOSE 93 07/21/2020   BUN 10 07/21/2020   CREATININE 0.59 07/21/2020   BILITOT 0.5 07/21/2020   ALKPHOS 71 07/21/2020   AST 16 07/21/2020   ALT 12 07/21/2020   PROT 7.0 07/21/2020   ALBUMIN 4.3 07/21/2020   CALCIUM 9.5 07/21/2020   GFR 98.57 07/21/2020   Lab Results  Component Value Date   CHOL 238 (H) 07/21/2020   Lab Results  Component Value Date   HDL 53.40 07/21/2020   Lab Results  Component Value Date   LDLCALC 173 (H) 07/21/2020   Lab Results  Component Value Date   TRIG 62.0 07/21/2020   Lab Results  Component Value Date   CHOLHDL 4 07/21/2020   Lab Results  Component Value Date    HGBA1C 6.0 07/04/2019      Assessment & Plan:   Problem List Items Addressed This Visit       Musculoskeletal and Integument   Bursitis of left shoulder  Relevant Medications   meloxicam (MOBIC) 7.5 MG tablet   Other Relevant Orders   DG Shoulder Left     Other   Elevated LDL cholesterol level - Primary   Relevant Orders   Comprehensive metabolic panel   Lipid panel   Other Visit Diagnoses     Arm pain, anterior, left       Relevant Orders   D-Dimer, Quantitative       Meds ordered this encounter  Medications   meloxicam (MOBIC) 7.5 MG tablet    Sig: Take 1 tablet (7.5 mg total) by mouth daily.    Dispense:  30 tablet    Refill:  0    Follow-up: Return in about 6 weeks (around 04/09/2021), or if symptoms worsen or fail to improve.  We will start Mobic today.  Information was given on bursitis suggested exercises given.  Mention given on preventing high cholesterol.  She will take Mobic for months.  Follow-up if not improving.  Lipitor.  Libby Maw, MD

## 2021-03-02 ENCOUNTER — Encounter: Payer: Self-pay | Admitting: Family Medicine

## 2021-03-05 ENCOUNTER — Other Ambulatory Visit (HOSPITAL_COMMUNITY): Payer: Self-pay

## 2021-07-15 ENCOUNTER — Ambulatory Visit (INDEPENDENT_AMBULATORY_CARE_PROVIDER_SITE_OTHER): Payer: 59 | Admitting: Family Medicine

## 2021-07-15 ENCOUNTER — Encounter: Payer: Self-pay | Admitting: Family Medicine

## 2021-07-15 VITALS — BP 122/74 | HR 67 | Temp 96.9°F | Ht <= 58 in | Wt 161.6 lb

## 2021-07-15 DIAGNOSIS — Z131 Encounter for screening for diabetes mellitus: Secondary | ICD-10-CM

## 2021-07-15 DIAGNOSIS — Z0000 Encounter for general adult medical examination without abnormal findings: Secondary | ICD-10-CM | POA: Diagnosis not present

## 2021-07-15 DIAGNOSIS — T466X5A Adverse effect of antihyperlipidemic and antiarteriosclerotic drugs, initial encounter: Secondary | ICD-10-CM | POA: Diagnosis not present

## 2021-07-15 DIAGNOSIS — M791 Myalgia, unspecified site: Secondary | ICD-10-CM | POA: Diagnosis not present

## 2021-07-15 DIAGNOSIS — Z23 Encounter for immunization: Secondary | ICD-10-CM | POA: Diagnosis not present

## 2021-07-15 DIAGNOSIS — E7800 Pure hypercholesterolemia, unspecified: Secondary | ICD-10-CM

## 2021-07-15 LAB — COMPREHENSIVE METABOLIC PANEL
ALT: 13 U/L (ref 0–35)
AST: 18 U/L (ref 0–37)
Albumin: 4.2 g/dL (ref 3.5–5.2)
Alkaline Phosphatase: 70 U/L (ref 39–117)
BUN: 10 mg/dL (ref 6–23)
CO2: 27 mEq/L (ref 19–32)
Calcium: 9.8 mg/dL (ref 8.4–10.5)
Chloride: 105 mEq/L (ref 96–112)
Creatinine, Ser: 0.68 mg/dL (ref 0.40–1.20)
GFR: 94.6 mL/min (ref >60.00)
Glucose, Bld: 89 mg/dL (ref 70–99)
Potassium: 3.6 mEq/L (ref 3.5–5.1)
Sodium: 139 mEq/L (ref 135–145)
Total Bilirubin: 0.6 mg/dL (ref 0.2–1.2)
Total Protein: 7.4 g/dL (ref 6.0–8.3)

## 2021-07-15 LAB — CBC WITH DIFFERENTIAL/PLATELET
Basophils Absolute: 0 10*3/uL (ref 0.0–0.1)
Basophils Relative: 0.7 % (ref 0.0–3.0)
Eosinophils Absolute: 0.3 10*3/uL (ref 0.0–0.7)
Eosinophils Relative: 7.6 % — ABNORMAL HIGH (ref 0.0–5.0)
HCT: 40.1 % (ref 36.0–46.0)
Hemoglobin: 13.2 g/dL (ref 12.0–15.0)
Lymphocytes Relative: 44.6 % (ref 12.0–46.0)
Lymphs Abs: 1.9 10*3/uL (ref 0.7–4.0)
MCHC: 33 g/dL (ref 30.0–36.0)
MCV: 94.9 fl (ref 78.0–100.0)
Monocytes Absolute: 0.4 10*3/uL (ref 0.1–1.0)
Monocytes Relative: 8.6 % (ref 3.0–12.0)
Neutro Abs: 1.7 10*3/uL (ref 1.4–7.7)
Neutrophils Relative %: 38.5 % — ABNORMAL LOW (ref 43.0–77.0)
Platelets: 177 10*3/uL (ref 150.0–400.0)
RBC: 4.22 Mil/uL (ref 3.87–5.11)
RDW: 13.2 % (ref 11.5–15.5)
WBC: 4.3 10*3/uL (ref 4.0–10.5)

## 2021-07-15 LAB — URINALYSIS
Bilirubin Urine: NEGATIVE
Hgb urine dipstick: NEGATIVE
Ketones, ur: NEGATIVE
Leukocytes,Ua: NEGATIVE
Nitrite: NEGATIVE
Specific Gravity, Urine: <=1.005 — AB (ref 1.000–1.030)
Total Protein, Urine: NEGATIVE
Urine Glucose: NEGATIVE
Urobilinogen, UA: 0.2 (ref 0.0–1.0)
pH: 7 (ref 5.0–8.0)

## 2021-07-15 LAB — LIPID PANEL
Cholesterol: 239 mg/dL — ABNORMAL HIGH (ref 0–200)
HDL: 56.4 mg/dL (ref >39.00)
LDL Cholesterol: 169 mg/dL — ABNORMAL HIGH (ref 0–99)
NonHDL: 182.81
Total CHOL/HDL Ratio: 4
Triglycerides: 70 mg/dL (ref 0.0–149.0)
VLDL: 14 mg/dL (ref 0.0–40.0)

## 2021-07-15 LAB — HEMOGLOBIN A1C: Hgb A1c MFr Bld: 6 % (ref 4.6–6.5)

## 2021-07-15 NOTE — Progress Notes (Signed)
Established Patient Office Visit  Subjective   Patient ID: Stacey Weiss, female    DOB: 08/03/1960  Age: 61 y.o. MRN: [id number]  Chief Complaint  Patient presents with   Annual Exam    CPE, no concerns. Patient fasting.     HPI for yearly physical.  Doing well.  Exercising regularly.  Up-to-date with health maintenance.  Accompanied by her husband today.  Hesitant to go for dental care.  History of elevated LDL cholesterol.  She has been on a low-fat low-cholesterol diet.  Developed myalgias she says after taking atorvastatin.    Review of Systems  Constitutional:  Negative for chills, diaphoresis, malaise/fatigue and weight loss.  HENT: Negative.    Eyes: Negative.  Negative for blurred vision and double vision.  Respiratory: Negative.    Cardiovascular:  Negative for chest pain.  Gastrointestinal:  Negative for abdominal pain.  Genitourinary: Negative.   Musculoskeletal:  Negative for falls and myalgias.  Neurological:  Negative for speech change, loss of consciousness and weakness.  Psychiatric/Behavioral: Negative.        Objective:     BP 122/74 (BP Location: Right Arm, Patient Position: Sitting, Cuff Size: Normal)   Pulse 67   Temp (!) 96.9 F (36.1 C) (Temporal)   Ht '4\' 9"'$  (1.448 m)   Wt 161 lb 9.6 oz (73.3 kg)   SpO2 96%   BMI 34.97 kg/m    Physical Exam Constitutional:      General: She is not in acute distress.    Appearance: Normal appearance. She is not ill-appearing, toxic-appearing or diaphoretic.  HENT:     Head: Normocephalic and atraumatic.     Right Ear: External ear normal.     Left Ear: External ear normal.     Mouth/Throat:     Mouth: Mucous membranes are moist.     Pharynx: Oropharynx is clear. No oropharyngeal exudate or posterior oropharyngeal erythema.  Eyes:     General: No scleral icterus.       Right eye: No discharge.        Left eye: No discharge.     Extraocular Movements: Extraocular movements intact.      Conjunctiva/sclera: Conjunctivae normal.     Pupils: Pupils are equal, round, and reactive to light.  Neck:     Vascular: No carotid bruit.  Cardiovascular:     Rate and Rhythm: Normal rate and regular rhythm.  Pulmonary:     Effort: Pulmonary effort is normal. No respiratory distress.     Breath sounds: Normal breath sounds.  Abdominal:     General: Bowel sounds are normal.  Musculoskeletal:     Cervical back: No rigidity or tenderness.  Lymphadenopathy:     Cervical: No cervical adenopathy.  Skin:    General: Skin is warm and dry.  Neurological:     Mental Status: She is alert and oriented to person, place, and time.  Psychiatric:        Mood and Affect: Mood normal.        Behavior: Behavior normal.      No results found for any visits on 07/15/21.    The 10-year ASCVD risk score (Arnett DK, et al., 2019) is: 5.2%    Assessment & Plan:   Problem List Items Addressed This Visit       Other   Healthcare maintenance   Relevant Orders   CBC with Differential/Platelet   Urinalysis   Elevated LDL cholesterol level   Relevant Orders   Comprehensive  metabolic panel   Lipid panel   Myalgia due to statin   Need for shingles vaccine - Primary   Relevant Orders   Varicella-zoster vaccine IM (Completed)    Return in about 6 months (around 01/14/2022).   Discussed starting statin other than atorvastatin if LDL remains elevated.  Continue exercising.  Information was given on health maintenance and disease prevention.  Urged her to seek regular dental care.  Information was given about health maintenance and disease prevention.  Information on high cholesterol was given.  Continue low-fat low-cholesterol diet Libby Maw, MD

## 2021-07-31 ENCOUNTER — Other Ambulatory Visit (HOSPITAL_COMMUNITY): Payer: Self-pay

## 2021-07-31 MED ORDER — MOMETASONE FUROATE 0.1 % EX CREA
1.0000 | TOPICAL_CREAM | Freq: Every day | CUTANEOUS | 2 refills | Status: AC
  Filled 2021-07-31: qty 90, 30d supply, fill #0

## 2021-08-03 ENCOUNTER — Other Ambulatory Visit (HOSPITAL_COMMUNITY): Payer: Self-pay

## 2021-08-04 ENCOUNTER — Other Ambulatory Visit (HOSPITAL_COMMUNITY): Payer: Self-pay

## 2021-08-06 ENCOUNTER — Other Ambulatory Visit (HOSPITAL_COMMUNITY): Payer: Self-pay

## 2021-08-06 ENCOUNTER — Encounter: Payer: Self-pay | Admitting: Family Medicine

## 2021-08-06 ENCOUNTER — Ambulatory Visit: Payer: 59 | Admitting: Family Medicine

## 2021-08-06 VITALS — BP 118/68 | HR 63 | Temp 97.2°F | Ht <= 58 in | Wt 155.0 lb

## 2021-08-06 DIAGNOSIS — B353 Tinea pedis: Secondary | ICD-10-CM | POA: Diagnosis not present

## 2021-08-06 DIAGNOSIS — E7800 Pure hypercholesterolemia, unspecified: Secondary | ICD-10-CM

## 2021-08-06 MED ORDER — TERBINAFINE HCL 250 MG PO TABS
250.0000 mg | ORAL_TABLET | Freq: Every day | ORAL | 0 refills | Status: DC
  Filled 2021-08-06: qty 10, 10d supply, fill #0

## 2021-08-06 NOTE — Progress Notes (Signed)
   Established Patient Office Visit  Subjective   Patient ID: Stacey Weiss, female    DOB: 1960-10-05  Age: 61 y.o. MRN: [id number]  Chief Complaint  Patient presents with   Nail Problem    Concerns about foot fungus on both feet little itchy sometimes.     HPI for evaluation of a pruritic rash on her lateral feet.  She has tried soaks and pumice stone without much relief.  She has decided to hold the statin and has greatly relieved and reduce the cholesterol in her diet.    Review of Systems  Constitutional:  Negative for chills, diaphoresis, malaise/fatigue and weight loss.  HENT: Negative.    Eyes: Negative.  Negative for blurred vision and double vision.  Cardiovascular:  Negative for chest pain.  Gastrointestinal:  Negative for abdominal pain.  Genitourinary: Negative.   Musculoskeletal:  Negative for falls and myalgias.  Skin:  Positive for itching and rash.  Neurological:  Negative for speech change, loss of consciousness and weakness.  Psychiatric/Behavioral: Negative.        Objective:     BP 118/68 (BP Location: Right Arm, Patient Position: Sitting, Cuff Size: Normal)   Pulse 63   Temp (!) 97.2 F (36.2 C) (Temporal)   Ht '4\' 9"'$  (1.448 m)   Wt 155 lb (70.3 kg)   SpO2 96%   BMI 33.54 kg/m    Physical Exam Constitutional:      General: She is not in acute distress.    Appearance: Normal appearance. She is not ill-appearing, toxic-appearing or diaphoretic.  HENT:     Head: Normocephalic and atraumatic.     Right Ear: External ear normal.     Left Ear: External ear normal.  Eyes:     General: No scleral icterus.       Right eye: No discharge.        Left eye: No discharge.     Extraocular Movements: Extraocular movements intact.     Conjunctiva/sclera: Conjunctivae normal.  Pulmonary:     Effort: Pulmonary effort is normal. No respiratory distress.  Musculoskeletal:     Cervical back: No rigidity or tenderness.  Skin:    General: Skin is warm  and dry.       Neurological:     Mental Status: She is alert and oriented to person, place, and time.  Psychiatric:        Mood and Affect: Mood normal.        Behavior: Behavior normal.      No results found for any visits on 08/06/21.    The 10-year ASCVD risk score (Arnett DK, et al., 2019) is: 4.8%    Assessment & Plan:   Problem List Items Addressed This Visit       Musculoskeletal and Integument   Tinea pedis of both feet   Relevant Medications   terbinafine (LAMISIL) 250 MG tablet     Other   Elevated LDL cholesterol level - Primary    Return For scheduled appointment to follow-up for elevated LDL cholesterol.Libby Maw, MD

## 2022-01-08 ENCOUNTER — Other Ambulatory Visit (HOSPITAL_COMMUNITY): Payer: Self-pay

## 2022-02-04 ENCOUNTER — Ambulatory Visit: Payer: 59 | Admitting: Family Medicine

## 2022-03-11 ENCOUNTER — Other Ambulatory Visit (HOSPITAL_COMMUNITY): Payer: Self-pay

## 2022-08-25 ENCOUNTER — Encounter (INDEPENDENT_AMBULATORY_CARE_PROVIDER_SITE_OTHER): Payer: Self-pay

## 2022-09-07 ENCOUNTER — Encounter: Payer: Commercial Managed Care - PPO | Admitting: Family Medicine

## 2022-09-08 ENCOUNTER — Encounter: Payer: Self-pay | Admitting: Family Medicine

## 2022-09-08 ENCOUNTER — Ambulatory Visit (INDEPENDENT_AMBULATORY_CARE_PROVIDER_SITE_OTHER): Payer: BC Managed Care – PPO | Admitting: Family Medicine

## 2022-09-08 VITALS — BP 104/64 | HR 67 | Temp 97.3°F | Ht <= 58 in | Wt 159.8 lb

## 2022-09-08 DIAGNOSIS — Z0000 Encounter for general adult medical examination without abnormal findings: Secondary | ICD-10-CM | POA: Diagnosis not present

## 2022-09-08 DIAGNOSIS — E559 Vitamin D deficiency, unspecified: Secondary | ICD-10-CM

## 2022-09-08 DIAGNOSIS — Z131 Encounter for screening for diabetes mellitus: Secondary | ICD-10-CM | POA: Diagnosis not present

## 2022-09-08 DIAGNOSIS — E7800 Pure hypercholesterolemia, unspecified: Secondary | ICD-10-CM | POA: Diagnosis not present

## 2022-09-08 DIAGNOSIS — Z23 Encounter for immunization: Secondary | ICD-10-CM | POA: Diagnosis not present

## 2022-09-08 LAB — URINALYSIS, ROUTINE W REFLEX MICROSCOPIC
Bilirubin Urine: NEGATIVE
Hgb urine dipstick: NEGATIVE
Ketones, ur: NEGATIVE
Nitrite: NEGATIVE
Specific Gravity, Urine: <=1.005 — AB (ref 1.000–1.030)
Total Protein, Urine: NEGATIVE
Urine Glucose: NEGATIVE
Urobilinogen, UA: 0.2 (ref 0.0–1.0)
pH: 7 (ref 5.0–8.0)

## 2022-09-08 LAB — CBC
HCT: 41.9 % (ref 36.0–46.0)
Hemoglobin: 13.7 g/dL (ref 12.0–15.0)
MCHC: 32.7 g/dL (ref 30.0–36.0)
MCV: 96.4 fl (ref 78.0–100.0)
Platelets: 209 10*3/uL (ref 150.0–400.0)
RBC: 4.34 Mil/uL (ref 3.87–5.11)
RDW: 13 % (ref 11.5–15.5)
WBC: 3.6 10*3/uL — ABNORMAL LOW (ref 4.0–10.5)

## 2022-09-08 LAB — LIPID PANEL
Cholesterol: 268 mg/dL — ABNORMAL HIGH (ref 0–200)
HDL: 58.3 mg/dL (ref >39.00)
LDL Cholesterol: 197 mg/dL — ABNORMAL HIGH (ref 0–99)
NonHDL: 209.82
Total CHOL/HDL Ratio: 5
Triglycerides: 66 mg/dL (ref 0.0–149.0)
VLDL: 13.2 mg/dL (ref 0.0–40.0)

## 2022-09-08 LAB — COMPREHENSIVE METABOLIC PANEL
ALT: 17 U/L (ref 0–35)
AST: 20 U/L (ref 0–37)
Albumin: 4.4 g/dL (ref 3.5–5.2)
Alkaline Phosphatase: 72 U/L (ref 39–117)
BUN: 10 mg/dL (ref 6–23)
CO2: 25 mEq/L (ref 19–32)
Calcium: 9.5 mg/dL (ref 8.4–10.5)
Chloride: 105 mEq/L (ref 96–112)
Creatinine, Ser: 0.69 mg/dL (ref 0.40–1.20)
GFR: 93.51 mL/min (ref >60.00)
Glucose, Bld: 83 mg/dL (ref 70–99)
Potassium: 4 mEq/L (ref 3.5–5.1)
Sodium: 138 mEq/L (ref 135–145)
Total Bilirubin: 0.6 mg/dL (ref 0.2–1.2)
Total Protein: 7.1 g/dL (ref 6.0–8.3)

## 2022-09-08 LAB — HEMOGLOBIN A1C: Hgb A1c MFr Bld: 5.8 % (ref 4.6–6.5)

## 2022-09-08 LAB — VITAMIN D 25 HYDROXY (VIT D DEFICIENCY, FRACTURES): VITD: 19.84 ng/mL — ABNORMAL LOW (ref 30.00–100.00)

## 2022-09-08 NOTE — Progress Notes (Signed)
Established Patient Office Visit   Subjective:  Patient ID: Stacey Weiss, female    DOB: Mar 06, 1960  Age: 62 y.o. MRN: [id number]  Chief Complaint  Patient presents with   Annual Exam    CPE. Pt is fasting. No concerns    HPI Encounter Diagnoses  Name Primary?   Healthcare maintenance Yes   Elevated LDL cholesterol level    Need for shingles vaccine    Screening for diabetes mellitus    Vitamin D deficiency    She is here for annual physical.  She is doing well.  She is accompanied by her husband.  She continues to work at the hospital.  She is not exercising regularly.  She does have regular dental care.  Due for second shingles vaccine.  Has not had recommended follow-up with her GI provider.  She has tried to lower the fat and cholesterol in her diet.  She has no family history of diabetes.   Review of Systems  Constitutional: Negative.   HENT: Negative.    Eyes:  Negative for blurred vision, discharge and redness.  Respiratory: Negative.    Cardiovascular: Negative.   Gastrointestinal:  Negative for abdominal pain, blood in stool, constipation and melena.  Genitourinary: Negative.  Negative for dysuria, frequency and hematuria.  Musculoskeletal: Negative.  Negative for myalgias.  Skin:  Negative for rash.  Neurological:  Negative for tingling, loss of consciousness and weakness.  Endo/Heme/Allergies:  Negative for polydipsia.     Current Outpatient Medications:    mometasone (ELOCON) 0.1 % cream, Apply 1 application liberally to affected area daily., Disp: 90 g, Rfl: 2   triamcinolone ointment (KENALOG) 0.1 %, Apply topically to affected area 2 (two) times daily., Disp: 454 g, Rfl: 1   terbinafine (LAMISIL) 250 MG tablet, Take 1 tablet (250 mg total) by mouth daily for 10 days, Disp: 10 tablet, Rfl: 0   Objective:     BP 104/64   Pulse 67   Temp (!) 97.3 F (36.3 C)   Ht 4\' 9"  (1.448 m)   Wt 159 lb 12.8 oz (72.5 kg)   SpO2 97%   BMI 34.58 kg/m     Physical Exam Constitutional:      General: She is not in acute distress.    Appearance: Normal appearance. She is not ill-appearing, toxic-appearing or diaphoretic.  HENT:     Head: Normocephalic and atraumatic.     Right Ear: Tympanic membrane, ear canal and external ear normal.     Left Ear: Tympanic membrane, ear canal and external ear normal.     Mouth/Throat:     Mouth: Mucous membranes are moist.     Pharynx: Oropharynx is clear. No oropharyngeal exudate or posterior oropharyngeal erythema.  Eyes:     General: No scleral icterus.       Right eye: No discharge.        Left eye: No discharge.     Extraocular Movements: Extraocular movements intact.     Conjunctiva/sclera: Conjunctivae normal.     Pupils: Pupils are equal, round, and reactive to light.  Cardiovascular:     Rate and Rhythm: Normal rate and regular rhythm.  Pulmonary:     Effort: Pulmonary effort is normal. No respiratory distress.     Breath sounds: Normal breath sounds.  Abdominal:     General: Bowel sounds are normal.  Musculoskeletal:     Cervical back: No rigidity or tenderness.  Skin:    General: Skin is warm and dry.  Neurological:     Mental Status: She is alert and oriented to person, place, and time.  Psychiatric:        Mood and Affect: Mood normal.        Behavior: Behavior normal.      No results found for any visits on 09/08/22.    The 10-year ASCVD risk score (Arnett DK, et al., 2019) is: 3.7%    Assessment & Plan:   Healthcare maintenance -     CBC -     Urinalysis, Routine w reflex microscopic  Elevated LDL cholesterol level -     Comprehensive metabolic panel -     Lipid panel  Need for shingles vaccine -     Varicella-zoster vaccine IM  Screening for diabetes mellitus -     Comprehensive metabolic panel -     Hemoglobin A1c  Vitamin D deficiency -     VITAMIN D 25 Hydroxy (Vit-D Deficiency, Fractures)    Return in about 6 months (around 03/11/2023), or if  symptoms worsen or fail to improve.  Advised, again, to see her GYN provider.  Second Shingrix vaccine today.  Advised 30 minutes of exercise daily outside of work.  Rechecking A1c.  She is in the prediabetic range.  She was given information about prediabetes.  Advised to lose weight.  Rechecking LDL cholesterol.  Information given on health maintenance and disease prevention.  Mliss Sax, MD

## 2022-09-22 ENCOUNTER — Encounter: Payer: Self-pay | Admitting: Family Medicine

## 2022-09-22 ENCOUNTER — Ambulatory Visit: Payer: BC Managed Care – PPO | Admitting: Family Medicine

## 2022-09-22 ENCOUNTER — Other Ambulatory Visit (HOSPITAL_COMMUNITY): Payer: Self-pay

## 2022-09-22 VITALS — BP 120/80 | HR 64 | Temp 97.3°F | Ht <= 58 in | Wt 165.0 lb

## 2022-09-22 DIAGNOSIS — R7303 Prediabetes: Secondary | ICD-10-CM | POA: Diagnosis not present

## 2022-09-22 DIAGNOSIS — E7800 Pure hypercholesterolemia, unspecified: Secondary | ICD-10-CM

## 2022-09-22 DIAGNOSIS — E559 Vitamin D deficiency, unspecified: Secondary | ICD-10-CM

## 2022-09-22 MED ORDER — PRAVASTATIN SODIUM 20 MG PO TABS
20.0000 mg | ORAL_TABLET | Freq: Every evening | ORAL | 1 refills | Status: AC
  Filled 2022-09-22: qty 90, 90d supply, fill #0

## 2022-09-22 NOTE — Progress Notes (Signed)
Established Patient Office Visit   Subjective:  Patient ID: Stacey Weiss, female    DOB: 19-Jan-1961  Age: 62 y.o. MRN: [id number]  Chief Complaint  Patient presents with   Medical Management of Chronic Issues    Discuss labs    HPI Encounter Diagnoses  Name Primary?   Elevated LDL cholesterol level Yes   Prediabetes    Vitamin D deficiency    For follow-up discussion of lab work that revealed elevated LDL cholesterol, prediabetes and vitamin D deficiency.  She reported that atorvastatin taken in the past caused myalgias.  She is not interested in taking the medication for prediabetes but would like a chance to adjust her diet and try to lose some weight.  She is taking 4000 international units of vitamin D daily that she recently started after she saw the labs.   Review of Systems  Constitutional: Negative.   HENT: Negative.    Eyes:  Negative for blurred vision, discharge and redness.  Respiratory: Negative.    Cardiovascular: Negative.   Gastrointestinal:  Negative for abdominal pain.  Genitourinary: Negative.   Musculoskeletal: Negative.  Negative for myalgias.  Skin:  Negative for rash.  Neurological:  Negative for tingling, loss of consciousness and weakness.  Endo/Heme/Allergies:  Negative for polydipsia.     Current Outpatient Medications:    mometasone (ELOCON) 0.1 % cream, Apply 1 application liberally to affected area daily., Disp: 90 g, Rfl: 2   pravastatin (PRAVACHOL) 20 MG tablet, Take 1 tablet (20 mg total) by mouth at bedtime., Disp: 90 tablet, Rfl: 1   triamcinolone ointment (KENALOG) 0.1 %, Apply topically to affected area 2 (two) times daily., Disp: 454 g, Rfl: 1   Objective:     BP 120/80   Pulse 64   Temp (!) 97.3 F (36.3 C)   Ht 4\' 9"  (1.448 m)   Wt 165 lb (74.8 kg)   SpO2 96%   BMI 35.71 kg/m  Wt Readings from Last 3 Encounters:  09/22/22 165 lb (74.8 kg)  09/08/22 159 lb 12.8 oz (72.5 kg)  08/06/21 155 lb (70.3 kg)       Physical Exam Constitutional:      General: She is not in acute distress.    Appearance: Normal appearance. She is not ill-appearing, toxic-appearing or diaphoretic.  HENT:     Head: Normocephalic and atraumatic.     Right Ear: External ear normal.     Left Ear: External ear normal.  Eyes:     General: No scleral icterus.       Right eye: No discharge.        Left eye: No discharge.     Extraocular Movements: Extraocular movements intact.     Conjunctiva/sclera: Conjunctivae normal.  Pulmonary:     Effort: Pulmonary effort is normal. No respiratory distress.  Skin:    General: Skin is warm and dry.  Neurological:     Mental Status: She is alert and oriented to person, place, and time.  Psychiatric:        Mood and Affect: Mood normal.        Behavior: Behavior normal.      No results found for any visits on 09/22/22.    The 10-year ASCVD risk score (Arnett DK, et al., 2019) is: 6%    Assessment & Plan:   Elevated LDL cholesterol level -     Pravastatin Sodium; Take 1 tablet (20 mg total) by mouth at bedtime.  Dispense: 90 tablet; Refill: 1  Prediabetes  Vitamin D deficiency    Return in about 6 months (around 03/25/2023).  Patient agrees to start pravastatin 20 mg.  She will let me know if she develops any myalgias with it.  She is not interested in treating her prediabetes at this time with the medication.  She would like to adjust her diet and try to lose some weight.  Information was given on prediabetes with the recommended nutrition plan.  Encouraged regular exercise such as walking for 30 minutes most days of the week outside of work.  She recently started 4000 international units of vitamin D daily.  She understands that her risk for vascular disease is elevated with her history of prediabetes and elevated LDL cholesterol.  Mliss Sax, MD

## 2022-10-04 ENCOUNTER — Other Ambulatory Visit (HOSPITAL_COMMUNITY): Payer: Self-pay

## 2022-10-05 ENCOUNTER — Other Ambulatory Visit (HOSPITAL_COMMUNITY): Payer: Self-pay

## 2022-11-10 ENCOUNTER — Telehealth: Payer: Self-pay | Admitting: Family Medicine

## 2022-11-10 NOTE — Telephone Encounter (Signed)
11/10/22 - Pt called wanting a call back from the CMA per letter of recommendation (fitness to travel) for herself and spouse.  Call back # [phone]

## 2022-11-17 ENCOUNTER — Telehealth: Payer: Self-pay | Admitting: Family Medicine

## 2022-11-17 NOTE — Telephone Encounter (Signed)
Pt would like for you to give her a call

## 2022-11-18 NOTE — Telephone Encounter (Signed)
Tried calling home number and no answer or answering service set up.  Will try later. Dm/cma

## 2023-03-11 ENCOUNTER — Ambulatory Visit: Payer: BC Managed Care – PPO | Admitting: Family Medicine
